# Patient Record
Sex: Female | Born: 1985 | Race: Black or African American | Hispanic: No | Smoking: Current every day smoker
Health system: Southern US, Community
[De-identification: ages and names within clinical notes are randomized; demographics above are authoritative.]

## PROBLEM LIST (undated history)

## (undated) DIAGNOSIS — F1910 Other psychoactive substance abuse, uncomplicated: Secondary | ICD-10-CM

## (undated) DIAGNOSIS — O99320 Drug use complicating pregnancy, unspecified trimester: Secondary | ICD-10-CM

## (undated) HISTORY — PX: ORTHOPEDIC SURGERY: SHX850

---

## 2016-03-24 ENCOUNTER — Emergency Department (HOSPITAL_COMMUNITY): Payer: No Typology Code available for payment source

## 2016-03-24 ENCOUNTER — Inpatient Hospital Stay (HOSPITAL_COMMUNITY): Payer: No Typology Code available for payment source

## 2016-03-24 ENCOUNTER — Encounter (HOSPITAL_COMMUNITY): Payer: Self-pay | Admitting: Emergency Medicine

## 2016-03-24 ENCOUNTER — Inpatient Hospital Stay (HOSPITAL_COMMUNITY)
Admission: EM | Admit: 2016-03-24 | Discharge: 2016-03-30 | DRG: 908 | Disposition: A | Discharge status: Home / Self Care | Payer: No Typology Code available for payment source | Attending: General Surgery | Admitting: General Surgery

## 2016-03-24 ENCOUNTER — Inpatient Hospital Stay (HOSPITAL_COMMUNITY): Payer: No Typology Code available for payment source | Admitting: Anesthesiology

## 2016-03-24 ENCOUNTER — Encounter (HOSPITAL_COMMUNITY): Admission: EM | Disposition: A | Discharge status: Home / Self Care | Payer: Self-pay | Source: Home / Self Care

## 2016-03-24 DIAGNOSIS — Z452 Encounter for adjustment and management of vascular access device: Secondary | ICD-10-CM

## 2016-03-24 DIAGNOSIS — S85152A Other specified injury of anterior tibial artery, left leg, initial encounter: Principal | ICD-10-CM | POA: Diagnosis present

## 2016-03-24 DIAGNOSIS — D62 Acute posthemorrhagic anemia: Secondary | ICD-10-CM | POA: Diagnosis present

## 2016-03-24 DIAGNOSIS — F1490 Cocaine use, unspecified, uncomplicated: Secondary | ICD-10-CM | POA: Diagnosis present

## 2016-03-24 DIAGNOSIS — Y92410 Unspecified street and highway as the place of occurrence of the external cause: Secondary | ICD-10-CM | POA: Diagnosis not present

## 2016-03-24 DIAGNOSIS — S81812A Laceration without foreign body, left lower leg, initial encounter: Secondary | ICD-10-CM | POA: Diagnosis present

## 2016-03-24 DIAGNOSIS — M21372 Foot drop, left foot: Secondary | ICD-10-CM | POA: Diagnosis not present

## 2016-03-24 DIAGNOSIS — I959 Hypotension, unspecified: Secondary | ICD-10-CM | POA: Diagnosis present

## 2016-03-24 DIAGNOSIS — D649 Anemia, unspecified: Secondary | ICD-10-CM

## 2016-03-24 DIAGNOSIS — T79A22A Traumatic compartment syndrome of left lower extremity, initial encounter: Secondary | ICD-10-CM | POA: Diagnosis present

## 2016-03-24 DIAGNOSIS — S85142A Laceration of anterior tibial artery, left leg, initial encounter: Secondary | ICD-10-CM

## 2016-03-24 DIAGNOSIS — R4182 Altered mental status, unspecified: Secondary | ICD-10-CM | POA: Diagnosis present

## 2016-03-24 DIAGNOSIS — F17210 Nicotine dependence, cigarettes, uncomplicated: Secondary | ICD-10-CM | POA: Diagnosis present

## 2016-03-24 DIAGNOSIS — S81819A Laceration without foreign body, unspecified lower leg, initial encounter: Secondary | ICD-10-CM

## 2016-03-24 DIAGNOSIS — R52 Pain, unspecified: Secondary | ICD-10-CM

## 2016-03-24 DIAGNOSIS — M7989 Other specified soft tissue disorders: Secondary | ICD-10-CM

## 2016-03-24 DIAGNOSIS — T148XXA Other injury of unspecified body region, initial encounter: Secondary | ICD-10-CM | POA: Diagnosis present

## 2016-03-24 HISTORY — PX: FASCIOTOMY: SHX132

## 2016-03-24 HISTORY — PX: ARTERY EXPLORATION: SHX5110

## 2016-03-24 LAB — CBC
HCT: 29.2 % — ABNORMAL LOW (ref 36.0–46.0)
HEMATOCRIT: 26.6 % — AB (ref 36.0–46.0)
HEMOGLOBIN: 8.7 g/dL — AB (ref 12.0–15.0)
Hemoglobin: 9.8 g/dL — ABNORMAL LOW (ref 12.0–15.0)
MCH: 32.8 pg (ref 26.0–34.0)
MCH: 29.2 pg (ref 26.0–34.0)
MCHC: 32.7 g/dL (ref 30.0–36.0)
MCHC: 33.6 g/dL (ref 30.0–36.0)
MCV: 100.4 fL — AB (ref 78.0–100.0)
MCV: 86.9 fL (ref 78.0–100.0)
PLATELETS: 76 10*3/uL — AB (ref 150–400)
Platelets: 206 10*3/uL (ref 150–400)
RBC: 2.65 MIL/uL — AB (ref 3.87–5.11)
RBC: 3.36 MIL/uL — ABNORMAL LOW (ref 3.87–5.11)
RDW: 12.5 % (ref 11.5–15.5)
RDW: 15.6 % — ABNORMAL HIGH (ref 11.5–15.5)
WBC: 3.5 10*3/uL — ABNORMAL LOW (ref 4.0–10.5)
WBC: 13.2 10*3/uL — AB (ref 4.0–10.5)

## 2016-03-24 LAB — BASIC METABOLIC PANEL
ANION GAP: 4 — AB (ref 5–15)
BUN: 8 mg/dL (ref 6–20)
CALCIUM: 7.2 mg/dL — AB (ref 8.9–10.3)
CO2: 29 mmol/L (ref 22–32)
Chloride: 107 mmol/L (ref 101–111)
Creatinine, Ser: 1.11 mg/dL — ABNORMAL HIGH (ref 0.44–1.00)
GFR calc Af Amer: >60 mL/min (ref >60)
GLUCOSE: 100 mg/dL — AB (ref 65–99)
Potassium: 3.8 mmol/L (ref 3.5–5.1)
Sodium: 140 mmol/L (ref 135–145)

## 2016-03-24 LAB — PREPARE RBC (CROSSMATCH)

## 2016-03-24 LAB — I-STAT CHEM 8, ED
BUN: 9 mg/dL (ref 6–20)
CALCIUM ION: 1.03 mmol/L — AB (ref 1.15–1.40)
Chloride: 99 mmol/L — ABNORMAL LOW (ref 101–111)
Creatinine, Ser: 1.6 mg/dL — ABNORMAL HIGH (ref 0.44–1.00)
GLUCOSE: 334 mg/dL — AB (ref 65–99)
HCT: 25 % — ABNORMAL LOW (ref 36.0–46.0)
HEMOGLOBIN: 8.5 g/dL — AB (ref 12.0–15.0)
Potassium: 3.3 mmol/L — ABNORMAL LOW (ref 3.5–5.1)
SODIUM: 136 mmol/L (ref 135–145)
TCO2: 16 mmol/L (ref 0–100)

## 2016-03-24 LAB — POCT I-STAT 4, (NA,K, GLUC, HGB,HCT)
Glucose, Bld: 102 mg/dL — ABNORMAL HIGH (ref 65–99)
Glucose, Bld: 105 mg/dL — ABNORMAL HIGH (ref 65–99)
HEMATOCRIT: 25 % — AB (ref 36.0–46.0)
HEMATOCRIT: 25 % — AB (ref 36.0–46.0)
HEMOGLOBIN: 8.5 g/dL — AB (ref 12.0–15.0)
HEMOGLOBIN: 8.5 g/dL — AB (ref 12.0–15.0)
Potassium: 3.6 mmol/L (ref 3.5–5.1)
Potassium: 5.1 mmol/L (ref 3.5–5.1)
Sodium: 141 mmol/L (ref 135–145)
Sodium: 141 mmol/L (ref 135–145)

## 2016-03-24 LAB — COMPREHENSIVE METABOLIC PANEL
ALBUMIN: 2.4 g/dL — AB (ref 3.5–5.0)
ALT: 10 U/L — ABNORMAL LOW (ref 14–54)
ANION GAP: 20 — AB (ref 5–15)
AST: 23 U/L (ref 15–41)
Alkaline Phosphatase: 39 U/L (ref 38–126)
BUN: 10 mg/dL (ref 6–20)
CALCIUM: 8.1 mg/dL — AB (ref 8.9–10.3)
CHLORIDE: 99 mmol/L — AB (ref 101–111)
CO2: 16 mmol/L — AB (ref 22–32)
Creatinine, Ser: 1.78 mg/dL — ABNORMAL HIGH (ref 0.44–1.00)
GFR calc non Af Amer: 16 mL/min — ABNORMAL LOW (ref >60)
GFR, EST AFRICAN AMERICAN: 19 mL/min — AB (ref >60)
Glucose, Bld: 342 mg/dL — ABNORMAL HIGH (ref 65–99)
POTASSIUM: 3.3 mmol/L — AB (ref 3.5–5.1)
SODIUM: 135 mmol/L (ref 135–145)
Total Bilirubin: 0.3 mg/dL (ref 0.3–1.2)
Total Protein: 4.5 g/dL — ABNORMAL LOW (ref 6.5–8.1)

## 2016-03-24 LAB — URINALYSIS, ROUTINE W REFLEX MICROSCOPIC
BILIRUBIN URINE: NEGATIVE
Glucose, UA: NEGATIVE mg/dL
KETONES UR: NEGATIVE mg/dL
Nitrite: NEGATIVE
PH: 7 (ref 5.0–8.0)
Protein, ur: NEGATIVE mg/dL
Specific Gravity, Urine: 1.021 (ref 1.005–1.030)

## 2016-03-24 LAB — PROTIME-INR
INR: 1.31
INR: 1.29
INR: 1.31
Prothrombin Time: 16.4 seconds — ABNORMAL HIGH (ref 11.4–15.2)
Prothrombin Time: 16.2 seconds — ABNORMAL HIGH (ref 11.4–15.2)
Prothrombin Time: 16.4 seconds — ABNORMAL HIGH (ref 11.4–15.2)

## 2016-03-24 LAB — APTT
APTT: 26 s (ref 24–36)
aPTT: 29 seconds (ref 24–36)

## 2016-03-24 LAB — LACTIC ACID, PLASMA: Lactic Acid, Venous: 1.7 mmol/L (ref 0.5–1.9)

## 2016-03-24 LAB — RAPID URINE DRUG SCREEN, HOSP PERFORMED
Amphetamines: NOT DETECTED
BARBITURATES: NOT DETECTED
Benzodiazepines: NOT DETECTED
COCAINE: POSITIVE — AB
Opiates: NOT DETECTED
TETRAHYDROCANNABINOL: NOT DETECTED

## 2016-03-24 LAB — I-STAT CG4 LACTIC ACID, ED: Lactic Acid, Venous: 12.69 mmol/L (ref 0.5–1.9)

## 2016-03-24 LAB — CDS SEROLOGY

## 2016-03-24 LAB — ABO/RH: ABO/RH(D): O POS

## 2016-03-24 LAB — MRSA PCR SCREENING: MRSA by PCR: NEGATIVE

## 2016-03-24 LAB — ETHANOL

## 2016-03-24 LAB — BLOOD PRODUCT ORDER (VERBAL) VERIFICATION

## 2016-03-24 SURGERY — FASCIOTOMY, UPPER EXTREMITY
Anesthesia: General | Site: Leg Lower | Laterality: Left

## 2016-03-24 MED ORDER — ONDANSETRON HCL 4 MG/2ML IJ SOLN: 4.0000 mg | Freq: Four times a day (QID) | INTRAMUSCULAR | Status: DC | PRN

## 2016-03-24 MED ORDER — PHENYLEPHRINE HCL 10 MG/ML IJ SOLN
INTRAMUSCULAR | Status: DC | PRN
  Administered 2016-03-24: 160 ug via INTRAVENOUS
  Administered 2016-03-24: 120 ug via INTRAVENOUS
  Administered 2016-03-24: 80 ug via INTRAVENOUS
  Administered 2016-03-24: 40 ug via INTRAVENOUS
  Administered 2016-03-24: 120 ug via INTRAVENOUS
  Administered 2016-03-24: 80 ug via INTRAVENOUS
  Administered 2016-03-24 (×2): 120 ug via INTRAVENOUS

## 2016-03-24 MED ORDER — TETANUS-DIPHTH-ACELL PERTUSSIS 5-2.5-18.5 LF-MCG/0.5 IM SUSP
INTRAMUSCULAR | Status: AC
  Filled 2016-03-24: qty 0.5

## 2016-03-24 MED ORDER — HYDROMORPHONE HCL 1 MG/ML IJ SOLN
0.5000 mg | INTRAMUSCULAR | Status: DC | PRN
  Administered 2016-03-24 – 2016-03-25 (×4): 1 mg via INTRAVENOUS
  Filled 2016-03-24 (×4): qty 1

## 2016-03-24 MED ORDER — MORPHINE SULFATE (PF) 4 MG/ML IV SOLN
INTRAVENOUS | Status: AC
  Filled 2016-03-24: qty 1

## 2016-03-24 MED ORDER — SUGAMMADEX SODIUM 500 MG/5ML IV SOLN
INTRAVENOUS | Status: DC | PRN
  Administered 2016-03-24: 300 mg via INTRAVENOUS

## 2016-03-24 MED ORDER — MORPHINE SULFATE (PF) 4 MG/ML IV SOLN
4.0000 mg | Freq: Once | INTRAVENOUS | Status: AC
Start: 2016-03-24 — End: 2016-03-24
  Administered 2016-03-24: 4 mg via INTRAVENOUS

## 2016-03-24 MED ORDER — ALBUMIN HUMAN 5 % IV SOLN
INTRAVENOUS | Status: DC | PRN
  Administered 2016-03-24 (×2): via INTRAVENOUS

## 2016-03-24 MED ORDER — ACETAMINOPHEN 325 MG PO TABS
325.0000 mg | ORAL_TABLET | ORAL | Status: DC | PRN
  Administered 2016-03-26 – 2016-03-28 (×6): 650 mg via ORAL
  Filled 2016-03-24 (×6): qty 2

## 2016-03-24 MED ORDER — LACTATED RINGERS IV SOLN
INTRAVENOUS | Status: DC | PRN
  Administered 2016-03-24 (×2): via INTRAVENOUS

## 2016-03-24 MED ORDER — MIDAZOLAM HCL 2 MG/2ML IJ SOLN
INTRAMUSCULAR | Status: AC
  Filled 2016-03-24: qty 2

## 2016-03-24 MED ORDER — OXYCODONE HCL 5 MG/5ML PO SOLN: 5.0000 mg | Freq: Once | ORAL | Status: DC | PRN

## 2016-03-24 MED ORDER — SUCCINYLCHOLINE CHLORIDE 200 MG/10ML IV SOSY
PREFILLED_SYRINGE | INTRAVENOUS | Status: AC
  Filled 2016-03-24: qty 10

## 2016-03-24 MED ORDER — HYDRALAZINE HCL 20 MG/ML IJ SOLN
5.0000 mg | INTRAMUSCULAR | Status: DC | PRN
  Administered 2016-03-27: 5 mg via INTRAVENOUS
  Filled 2016-03-24: qty 1

## 2016-03-24 MED ORDER — GUAIFENESIN-DM 100-10 MG/5ML PO SYRP: 15.0000 mL | ORAL_SOLUTION | ORAL | Status: DC | PRN

## 2016-03-24 MED ORDER — SODIUM CHLORIDE 0.9 % IV SOLN: 10.0000 mL/h | Freq: Once | INTRAVENOUS | Status: DC

## 2016-03-24 MED ORDER — PHENYLEPHRINE HCL 10 MG/ML IJ SOLN
INTRAVENOUS | Status: DC | PRN
  Administered 2016-03-24: 50 ug/min via INTRAVENOUS

## 2016-03-24 MED ORDER — ACETAMINOPHEN 325 MG PO TABS
650.0000 mg | ORAL_TABLET | ORAL | Status: DC | PRN
  Administered 2016-03-24: 650 mg via ORAL
  Filled 2016-03-24: qty 2

## 2016-03-24 MED ORDER — HEMOSTATIC AGENTS (NO CHARGE) OPTIME
TOPICAL | Status: DC | PRN
  Administered 2016-03-24: 1 via TOPICAL

## 2016-03-24 MED ORDER — OXYCODONE HCL 5 MG PO TABS
10.0000 mg | ORAL_TABLET | ORAL | Status: DC | PRN
  Administered 2016-03-24 – 2016-03-26 (×10): 10 mg via ORAL
  Filled 2016-03-24 (×11): qty 2

## 2016-03-24 MED ORDER — LACTATED RINGERS IV SOLN
INTRAVENOUS | Status: DC | PRN
  Administered 2016-03-24: 07:00:00 via INTRAVENOUS

## 2016-03-24 MED ORDER — SODIUM CHLORIDE 0.9 % IV SOLN
INTRAVENOUS | Status: DC | PRN
  Administered 2016-03-24: 1000 mL via INTRAVENOUS

## 2016-03-24 MED ORDER — ONDANSETRON HCL 4 MG/2ML IJ SOLN
4.0000 mg | Freq: Four times a day (QID) | INTRAMUSCULAR | Status: DC | PRN
  Filled 2016-03-24: qty 2

## 2016-03-24 MED ORDER — PANTOPRAZOLE SODIUM 40 MG PO TBEC: 40.0000 mg | DELAYED_RELEASE_TABLET | Freq: Every day | ORAL | Status: DC

## 2016-03-24 MED ORDER — HYDROMORPHONE HCL 2 MG/ML IJ SOLN
0.5000 mg | INTRAMUSCULAR | Status: DC | PRN
  Administered 2016-03-24: 0.5 mg via INTRAVENOUS
  Filled 2016-03-24: qty 1

## 2016-03-24 MED ORDER — LABETALOL HCL 5 MG/ML IV SOLN
10.0000 mg | INTRAVENOUS | Status: DC | PRN
  Filled 2016-03-24: qty 4

## 2016-03-24 MED ORDER — FENTANYL CITRATE (PF) 100 MCG/2ML IJ SOLN
INTRAMUSCULAR | Status: AC
  Filled 2016-03-24: qty 4

## 2016-03-24 MED ORDER — SUCCINYLCHOLINE CHLORIDE 20 MG/ML IJ SOLN
INTRAMUSCULAR | Status: DC | PRN
  Administered 2016-03-24 (×2): 100 mg via INTRAVENOUS

## 2016-03-24 MED ORDER — ONDANSETRON HCL 4 MG/2ML IJ SOLN
INTRAMUSCULAR | Status: AC
  Administered 2016-03-24: 4 mg
  Filled 2016-03-24: qty 2

## 2016-03-24 MED ORDER — METOPROLOL TARTRATE 5 MG/5ML IV SOLN
2.0000 mg | INTRAVENOUS | Status: DC | PRN
Start: 2016-03-24 — End: 2016-03-30

## 2016-03-24 MED ORDER — ONDANSETRON HCL 4 MG PO TABS: 4.0000 mg | ORAL_TABLET | Freq: Four times a day (QID) | ORAL | Status: DC | PRN

## 2016-03-24 MED ORDER — SODIUM CHLORIDE 0.9 % IV SOLN: 10.0000 mL/h | Freq: Once | INTRAVENOUS | Status: AC

## 2016-03-24 MED ORDER — IBUPROFEN 400 MG PO TABS: 600.0000 mg | ORAL_TABLET | Freq: Once | ORAL | Status: DC

## 2016-03-24 MED ORDER — PHENYLEPHRINE 40 MCG/ML (10ML) SYRINGE FOR IV PUSH (FOR BLOOD PRESSURE SUPPORT)
PREFILLED_SYRINGE | INTRAVENOUS | Status: AC
  Filled 2016-03-24: qty 20

## 2016-03-24 MED ORDER — PROPOFOL 10 MG/ML IV BOLUS
INTRAVENOUS | Status: AC
  Filled 2016-03-24: qty 20

## 2016-03-24 MED ORDER — ALUM & MAG HYDROXIDE-SIMETH 200-200-20 MG/5ML PO SUSP: 15.0000 mL | ORAL | Status: DC | PRN

## 2016-03-24 MED ORDER — EPHEDRINE SULFATE 50 MG/ML IJ SOLN
INTRAMUSCULAR | Status: DC | PRN
  Administered 2016-03-24: 10 mg via INTRAVENOUS

## 2016-03-24 MED ORDER — ACETAMINOPHEN 650 MG RE SUPP: 325.0000 mg | RECTAL | Status: DC | PRN

## 2016-03-24 MED ORDER — ROCURONIUM BROMIDE 100 MG/10ML IV SOLN
INTRAVENOUS | Status: DC | PRN
  Administered 2016-03-24: 50 mg via INTRAVENOUS

## 2016-03-24 MED ORDER — PROPOFOL 10 MG/ML IV BOLUS
INTRAVENOUS | Status: DC | PRN
  Administered 2016-03-24: 120 mg via INTRAVENOUS
  Administered 2016-03-24: 200 mg via INTRAVENOUS

## 2016-03-24 MED ORDER — FENTANYL CITRATE (PF) 100 MCG/2ML IJ SOLN
50.0000 ug | Freq: Once | INTRAMUSCULAR | Status: AC
  Administered 2016-03-24: 50 ug via INTRAVENOUS

## 2016-03-24 MED ORDER — DEXTROSE 5 % IV SOLN
1.5000 g | Freq: Two times a day (BID) | INTRAVENOUS | Status: AC
  Administered 2016-03-24 – 2016-03-25 (×2): 1.5 g via INTRAVENOUS
  Filled 2016-03-24 (×3): qty 1.5

## 2016-03-24 MED ORDER — FENTANYL CITRATE (PF) 100 MCG/2ML IJ SOLN
INTRAMUSCULAR | Status: DC | PRN
  Administered 2016-03-24: 25 ug via INTRAVENOUS

## 2016-03-24 MED ORDER — CEFAZOLIN SODIUM 1 G IJ SOLR
INTRAMUSCULAR | Status: DC | PRN
  Administered 2016-03-24: 1 g via INTRAMUSCULAR

## 2016-03-24 MED ORDER — PHENOL 1.4 % MT LIQD
1.0000 | OROMUCOSAL | Status: DC | PRN
Start: 2016-03-24 — End: 2016-03-30

## 2016-03-24 MED ORDER — FENTANYL CITRATE (PF) 100 MCG/2ML IJ SOLN
INTRAMUSCULAR | Status: AC
  Administered 2016-03-24: 03:00:00
  Filled 2016-03-24: qty 2

## 2016-03-24 MED ORDER — CHLORHEXIDINE GLUCONATE CLOTH 2 % EX PADS: 6.0000 | MEDICATED_PAD | Freq: Every day | CUTANEOUS | Status: DC

## 2016-03-24 MED ORDER — LIDOCAINE HCL (CARDIAC) 20 MG/ML IV SOLN
INTRAVENOUS | Status: DC | PRN
  Administered 2016-03-24: 60 mg via INTRAVENOUS

## 2016-03-24 MED ORDER — ONDANSETRON HCL 4 MG/2ML IJ SOLN
INTRAMUSCULAR | Status: AC
  Filled 2016-03-24: qty 2

## 2016-03-24 MED ORDER — ONDANSETRON HCL 4 MG/2ML IJ SOLN
4.0000 mg | Freq: Four times a day (QID) | INTRAMUSCULAR | Status: AC | PRN
  Administered 2016-03-24: 4 mg via INTRAVENOUS

## 2016-03-24 MED ORDER — 0.9 % SODIUM CHLORIDE (POUR BTL) OPTIME
TOPICAL | Status: DC | PRN
  Administered 2016-03-24: 1000 mL

## 2016-03-24 MED ORDER — OXYCODONE HCL 5 MG PO TABS: 5.0000 mg | ORAL_TABLET | Freq: Once | ORAL | Status: DC | PRN

## 2016-03-24 MED ORDER — SODIUM CHLORIDE 0.9 % IV SOLN
INTRAVENOUS | Status: DC
  Administered 2016-03-24: 21:00:00 via INTRAVENOUS
  Administered 2016-03-24: 100 mL/h via INTRAVENOUS
  Administered 2016-03-24 – 2016-03-28 (×3): via INTRAVENOUS

## 2016-03-24 MED ORDER — HYDROMORPHONE HCL 1 MG/ML IJ SOLN: 0.2500 mg | INTRAMUSCULAR | Status: DC | PRN

## 2016-03-24 MED ORDER — LIDOCAINE 5 % EX PTCH: 1.0000 | MEDICATED_PATCH | Freq: Once | CUTANEOUS | Status: DC

## 2016-03-24 MED ORDER — IOPAMIDOL (ISOVUE-300) INJECTION 61%
INTRAVENOUS | Status: AC
  Filled 2016-03-24: qty 100

## 2016-03-24 MED ORDER — PANTOPRAZOLE SODIUM 40 MG PO TBEC
40.0000 mg | DELAYED_RELEASE_TABLET | Freq: Every day | ORAL | Status: DC
  Administered 2016-03-25: 40 mg via ORAL
  Filled 2016-03-24: qty 1

## 2016-03-24 MED ORDER — TETANUS-DIPHTH-ACELL PERTUSSIS 5-2.5-18.5 LF-MCG/0.5 IM SUSP
0.5000 mL | Freq: Once | INTRAMUSCULAR | Status: AC
  Administered 2016-03-24: 0.5 mL via INTRAMUSCULAR

## 2016-03-24 MED ORDER — SODIUM CHLORIDE 0.9 % IV BOLUS (SEPSIS)
1000.0000 mL | Freq: Once | INTRAVENOUS | Status: AC
Start: 2016-03-24 — End: 2016-03-24
  Administered 2016-03-24: 1000 mL via INTRAVENOUS

## 2016-03-24 MED ORDER — FENTANYL CITRATE (PF) 100 MCG/2ML IJ SOLN
INTRAMUSCULAR | Status: DC | PRN
  Administered 2016-03-24: 50 ug via INTRAVENOUS

## 2016-03-24 MED ORDER — LIDOCAINE 2% (20 MG/ML) 5 ML SYRINGE
INTRAMUSCULAR | Status: AC
  Filled 2016-03-24: qty 5

## 2016-03-24 MED ORDER — DOCUSATE SODIUM 100 MG PO CAPS
100.0000 mg | ORAL_CAPSULE | Freq: Every day | ORAL | Status: DC
  Administered 2016-03-25 – 2016-03-30 (×6): 100 mg via ORAL
  Filled 2016-03-24 (×6): qty 1

## 2016-03-24 MED ORDER — PANTOPRAZOLE SODIUM 40 MG IV SOLR
40.0000 mg | Freq: Every day | INTRAVENOUS | Status: DC
  Administered 2016-03-24: 40 mg via INTRAVENOUS
  Filled 2016-03-24: qty 40

## 2016-03-24 MED ORDER — IOPAMIDOL (ISOVUE-370) INJECTION 76%
INTRAVENOUS | Status: AC
  Administered 2016-03-24: 100 mL via INTRAVENOUS
  Filled 2016-03-24: qty 100

## 2016-03-24 SURGICAL SUPPLY — 46 items
CANISTER SUCT 3000ML PPV (MISCELLANEOUS) ×2 IMPLANT
CANISTER WOUND CARE 500ML ATS (WOUND CARE) ×4 IMPLANT
CLIP TI MEDIUM 24 (CLIP) ×4 IMPLANT
CLIP TI MEDIUM 6 (CLIP) ×2 IMPLANT
CONNECTOR Y ATS VAC SYSTEM (MISCELLANEOUS) ×2 IMPLANT
COUNTER NEEDLE 20 DBL MAG RED (NEEDLE) ×2 IMPLANT
COVER SURGICAL LIGHT HANDLE (MISCELLANEOUS) ×2 IMPLANT
DRAPE ORTHO SPLIT 87X125 STRL (DRAPES) ×4 IMPLANT
DRSG TEGADERM 4X4.75 (GAUZE/BANDAGES/DRESSINGS) ×6 IMPLANT
DRSG VAC ATS MED SENSATRAC (GAUZE/BANDAGES/DRESSINGS) ×4 IMPLANT
ELECT REM PT RETURN 9FT ADLT (ELECTROSURGICAL) ×2
ELECTRODE REM PT RTRN 9FT ADLT (ELECTROSURGICAL) ×1 IMPLANT
GAUZE SPONGE 2X2 8PLY STRL LF (GAUZE/BANDAGES/DRESSINGS) ×2 IMPLANT
GAUZE SPONGE 4X4 12PLY STRL (GAUZE/BANDAGES/DRESSINGS) ×2 IMPLANT
GLOVE BIO SURGEON STRL SZ7.5 (GLOVE) ×2 IMPLANT
GLOVE BIOGEL PI IND STRL 6.5 (GLOVE) ×1 IMPLANT
GLOVE BIOGEL PI IND STRL 7.5 (GLOVE) ×2 IMPLANT
GLOVE BIOGEL PI INDICATOR 6.5 (GLOVE) ×1
GLOVE BIOGEL PI INDICATOR 7.5 (GLOVE) ×2
GLOVE SS BIOGEL STRL SZ 7.5 (GLOVE) ×1 IMPLANT
GLOVE SUPERSENSE BIOGEL SZ 7.5 (GLOVE) ×1
GLOVE SURG SS PI 7.0 STRL IVOR (GLOVE) ×2 IMPLANT
GOWN STRL REUS W/ TWL LRG LVL3 (GOWN DISPOSABLE) ×2 IMPLANT
GOWN STRL REUS W/TWL LRG LVL3 (GOWN DISPOSABLE) ×2
HEMOSTAT SPONGE AVITENE ULTRA (HEMOSTASIS) ×2 IMPLANT
KIT BASIN OR (CUSTOM PROCEDURE TRAY) ×2 IMPLANT
KIT ROOM TURNOVER OR (KITS) ×2 IMPLANT
NS IRRIG 1000ML POUR BTL (IV SOLUTION) ×2 IMPLANT
PACK GENERAL/GYN (CUSTOM PROCEDURE TRAY) ×2 IMPLANT
PAD ARMBOARD 7.5X6 YLW CONV (MISCELLANEOUS) ×4 IMPLANT
PAD NEG PRESSURE SENSATRAC (MISCELLANEOUS) ×2 IMPLANT
PAD SHARPS MAGNETIC DISPOSAL (MISCELLANEOUS) ×2 IMPLANT
SPONGE GAUZE 2X2 STER 10/PKG (GAUZE/BANDAGES/DRESSINGS) ×2
STAPLER VISISTAT 35W (STAPLE) IMPLANT
SUT ETHILON 3 0 PS 1 (SUTURE) IMPLANT
SUT PROLENE 5 0 C 1 24 (SUTURE) ×12 IMPLANT
SUT SILK 2 0 SH (SUTURE) ×2 IMPLANT
SUT SILK 3 0 (SUTURE) ×1
SUT SILK 3-0 18XBRD TIE 12 (SUTURE) ×1 IMPLANT
SUT VIC AB 2-0 CT1 27 (SUTURE) ×2
SUT VIC AB 2-0 CT1 TAPERPNT 27 (SUTURE) ×2 IMPLANT
SUT VIC AB 2-0 CTX 36 (SUTURE) IMPLANT
SUT VIC AB 3-0 SH 27 (SUTURE)
SUT VIC AB 3-0 SH 27X BRD (SUTURE) IMPLANT
SUT VICRYL 4-0 PS2 18IN ABS (SUTURE) IMPLANT
WATER STERILE IRR 1000ML POUR (IV SOLUTION) ×2 IMPLANT

## 2016-03-24 NOTE — Progress Notes (Signed)
Patient's belongings picked up from security and given back to patient. It was a cell phone and 45 cents. Ashlen Kiger, [place] ScrapeSarah E, RN

## 2016-03-24 NOTE — ED Notes (Signed)
First unit of FFP [id number] started.

## 2016-03-24 NOTE — Anesthesia Preprocedure Evaluation (Addendum)
Anesthesia Evaluation  Patient identified by MRN, date of birth, ID band Patient awake  General Assessment Comment:Lethargic but able to answer questions  Reviewed: Allergy & Precautions, NPO status , Patient's Chart, lab work & pertinent test results  Airway Mallampati: I  TM Distance: >3 FB Neck ROM: Full    Dental  (+) Poor Dentition,    Pulmonary Current Smoker,    Pulmonary exam normal breath sounds clear to auscultation       Cardiovascular negative cardio ROS Normal cardiovascular exam Rhythm:regular Rate:Normal     Neuro/Psych    GI/Hepatic   Endo/Other    Renal/GU Renal InsufficiencyRenal disease     Musculoskeletal   Abdominal   Peds  Hematology   Anesthesia Other Findings   Reproductive/Obstetrics                            Anesthesia Physical Anesthesia Plan  ASA: II and emergent  Anesthesia Plan: General   Post-op Pain Management:    Induction: Intravenous, Cricoid pressure planned and Rapid sequence  Airway Management Planned: Oral ETT  Additional Equipment:   Intra-op Plan:   Post-operative Plan: Extubation in OR  Informed Consent:   Plan Discussed with: CRNA, Anesthesiologist and Surgeon  Anesthesia Plan Comments:         Anesthesia Quick Evaluation

## 2016-03-24 NOTE — ED Notes (Signed)
PRBC num: [id number] started on rapid infusion.

## 2016-03-24 NOTE — Progress Notes (Signed)
1 FFP completed 1U RBC completed 1 Plasma completed  in PACU per MD
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
## 2016-03-24 NOTE — Progress Notes (Signed)
Patient ID: Jamie Kirk, female   DOB: 1985/10/27, 31 y.o.   MRN: [id number] Back from OR VAC L calf, palp DP VSS Clear liquids F/U CBC.  Jamie GelinasBurke Amelya Mabry, MD, MPH, FACS Trauma: [phone] General Surgery: [phone]

## 2016-03-24 NOTE — Anesthesia Procedure Notes (Signed)
Central Venous Catheter Insertion Performed by: Chaney MallingHODIERNE, Ambrielle Kington, anesthesiologist Start/End3/08/2016 7:11 AM, 03/24/2016 7:20 AM Patient location: OR. Preanesthetic checklist: patient identified, IV checked, site marked, risks and benefits discussed, surgical consent, monitors and equipment checked, pre-op evaluation, timeout performed and anesthesia consent Position: Trendelenburg Lidocaine 1% used for infiltration and patient sedated Hand hygiene performed , maximum sterile barriers used  and Seldinger technique used Catheter size: 7 Fr Central line was placed.Triple lumen Procedure performed using ultrasound guided technique. Ultrasound Notes:anatomy identified, needle tip was noted to be adjacent to the nerve/plexus identified and no ultrasound evidence of intravascular and/or intraneural injection Attempts: 1 Following insertion, line sutured, dressing applied and Biopatch. Post procedure assessment: blood return through all ports, free fluid flow and no air  Patient tolerated the procedure well with no immediate complications.

## 2016-03-24 NOTE — ED Notes (Signed)
First unit of FFP  Completed.

## 2016-03-24 NOTE — ED Notes (Signed)
Zofran 4mg  IV given with Fentanyl 50 mcg.
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
## 2016-03-24 NOTE — Anesthesia Postprocedure Evaluation (Signed)
Anesthesia Post Note  Patient: Jamie Kirk  Procedure(s) Performed: Procedure(s) (LRB): Left Leg Four Compartment Fasciotomy; Ligation of Anterior Tibial Artery; Application of Wound Vac (Left) ARTERY EXPLORATION (Left)  Patient location during evaluation: PACU Anesthesia Type: General Level of consciousness: awake and alert Pain management: pain level controlled Vital Signs Assessment: post-procedure vital signs reviewed and stable Respiratory status: spontaneous breathing, nonlabored ventilation and respiratory function stable Cardiovascular status: blood pressure returned to baseline and stable Postop Assessment: no signs of nausea or vomiting Anesthetic complications: no       Last Vitals:  Vitals:   03/24/16 1100 03/24/16 1200  BP: (!) 148/101 (!) 147/95  Pulse:    Resp: 10 14  Temp:      Last Pain:  Vitals:   03/24/16 1107  TempSrc:   PainSc: Asleep                 Johnel Yielding,W. EDMOND

## 2016-03-24 NOTE — ED Notes (Signed)
FFP completed.
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
## 2016-03-24 NOTE — ED Notes (Signed)
PRBC number: [id number] started.

## 2016-03-24 NOTE — ED Notes (Signed)
Second unit of FFP started unit num: [id number]

## 2016-03-24 NOTE — ED Notes (Signed)
PER DR. Lindie SpruceWYATT:  Pulses palpated and dopplered in RIGHT FOOT included: dorsalis pedis and posterior tibial   Pulses dopplered in LEFT FOOT included: Posterior tibial  Pulses were not palpable in LEFT FOOT, only able to obtain a dopplered posterior tibial.
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
## 2016-03-24 NOTE — Transfer of Care (Signed)
Immediate Anesthesia Transfer of Care Note  Patient: Jamie Kirk  Procedure(s) Performed: Procedure(s): Left Leg Four Compartment Fasciotomy; Ligation of Anterior Tibial Artery; Application of Wound Vac (Left) ARTERY EXPLORATION (Left)  Patient Location: PACU  Anesthesia Type:General  Level of Consciousness: patient cooperative and responds to stimulation  Airway & Oxygen Therapy: Patient Spontanous Breathing and Patient connected to nasal cannula oxygen  Post-op Assessment: Report given to RN, Post -op Vital signs reviewed and stable and Patient moving all extremities X 4  Post vital signs: Reviewed and stable  Last Vitals:  Vitals:   03/24/16 0435 03/24/16 0800  BP: 135/92 99/85  Pulse: 88   Resp: 16   Temp:  36.6 C    Last Pain:  Vitals:   03/24/16 0800  TempSrc: Temporal  PainSc: (P) Asleep         Complications: No apparent anesthesia complications

## 2016-03-24 NOTE — Anesthesia Procedure Notes (Signed)
Procedure Name: Intubation Date/Time: 03/24/2016 5:15 AM Performed by: Molli HazardGORDON, Jamie Kirk Pre-anesthesia Checklist: Patient identified, Emergency Drugs available, Suction available, Patient being monitored and Timeout performed Patient Re-evaluated:Patient Re-evaluated prior to inductionOxygen Delivery Method: Circle system utilized Preoxygenation: Pre-oxygenation with 100% oxygen Intubation Type: IV induction, Rapid sequence and Cricoid Pressure applied Laryngoscope Size: Miller and 2 Grade View: Grade I Tube type: Oral Tube size: 7.5 mm Number of attempts: 1 Airway Equipment and Method: Stylet Placement Confirmation: ETT inserted through vocal cords under direct vision,  positive ETCO2 and breath sounds checked- equal and bilateral Secured at: 21 cm Tube secured with: Tape Dental Injury: Teeth and Oropharynx as per pre-operative assessment

## 2016-03-24 NOTE — Progress Notes (Signed)
     Patient resting in PACU Doppler biphasic left PT Wound vac working well S/P Left leg 4 compartment fasciotomy with ligation of anterior tibial artery  Disposition stable left LE arterial flow  Shaelynn Dragos MAUREEN PA-C
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
## 2016-03-25 ENCOUNTER — Encounter (HOSPITAL_COMMUNITY): Payer: Self-pay

## 2016-03-25 ENCOUNTER — Encounter (HOSPITAL_COMMUNITY): Payer: Self-pay | Admitting: Vascular Surgery

## 2016-03-25 LAB — BPAM PLATELET PHERESIS
Blood Product Expiration Date: 201803082359
ISSUE DATE / TIME: 201803080733
Unit Type and Rh: 7300

## 2016-03-25 LAB — BPAM FFP
BLOOD PRODUCT EXPIRATION DATE: 201803122359
BLOOD PRODUCT EXPIRATION DATE: 201803122359
BLOOD PRODUCT EXPIRATION DATE: 201803112359
Blood Product Expiration Date: 201803112359
Blood Product Expiration Date: 201803112359
Blood Product Expiration Date: 201803112359
ISSUE DATE / TIME: 201803080229
ISSUE DATE / TIME: 201803080229
ISSUE DATE / TIME: 201803080733
ISSUE DATE / TIME: 201803080733
ISSUE DATE / TIME: 201803080733
ISSUE DATE / TIME: 201803080733
UNIT TYPE AND RH: 6200
UNIT TYPE AND RH: 5100
UNIT TYPE AND RH: 5100
Unit Type and Rh: 6200
Unit Type and Rh: 5100
Unit Type and Rh: 5100

## 2016-03-25 LAB — PREPARE FRESH FROZEN PLASMA
UNIT DIVISION: 0
UNIT DIVISION: 0
UNIT DIVISION: 0
Unit division: 0
Unit division: 0
Unit division: 0

## 2016-03-25 LAB — PREPARE PLATELET PHERESIS: Unit division: 0

## 2016-03-25 LAB — HIV ANTIBODY (ROUTINE TESTING W REFLEX): HIV SCREEN 4TH GENERATION: NONREACTIVE

## 2016-03-25 MED ORDER — HYDROMORPHONE HCL 2 MG/ML IJ SOLN
0.5000 mg | INTRAMUSCULAR | Status: DC | PRN
  Administered 2016-03-25 – 2016-03-26 (×12): 1 mg via INTRAVENOUS
  Filled 2016-03-25 (×13): qty 1
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
## 2016-03-25 NOTE — Progress Notes (Addendum)
  [facility] Progress Note  Subjective  - POD #1  Sleeping. Barely wakes up to answer questions. Nods when asked if her left foot numbness and pain is better.   Objective Vitals:   03/25/16 0300 03/25/16 0500  BP: 124/87 (!) 137/93  Pulse: 81 91  Resp: 10 (!) 22  Temp:  99.6 F (37.6 C)    Intake/Output Summary (Last 24 hours) at 03/25/16 0733 Last data filed at 03/25/16 0600  Gross per 24 hour  Intake           4650.5 ml  Output             3670 ml  Net            980.5 ml   Left lower extremity with VAC dressings Biphasic left DP, peroneal and PT signals. Left foot warm. Moving toes. Sensation intact.   Assessment/Planning: 31 y.o. female is s/p: left leg 4 compartment fasciotomy with ligation of anterior tibial artery 1 Day Post-Op   Motor and sensory function intact left foot. Will keep VAC on today. Will probably close fasciotomies next week, but will discuss with Dr. Darrick PennaFields.  Raymond GurneyKimberly A Trinh 03/25/2016 7:33 AM -- Agree with above.  Ok to ambulate.  VAC change Monday.  Possibly close fasciotomy Monday  Fabienne Brunsharles Fields, MD [facility] [place] Office: [phone] Pager: [phone]  Laboratory CBC    Component Value Date/Time   WBC 13.2 (H) 03/24/2016 1153   HGB 9.8 (L) 03/24/2016 1153   HCT 29.2 (L) 03/24/2016 1153   PLT 76 (L) 03/24/2016 1153    BMET    Component Value Date/Time   NA 140 03/24/2016 1153   K 3.8 03/24/2016 1153   CL 107 03/24/2016 1153   CO2 29 03/24/2016 1153   GLUCOSE 100 (H) 03/24/2016 1153   BUN 8 03/24/2016 1153   CREATININE 1.11 (H) 03/24/2016 1153   CALCIUM 7.2 (L) 03/24/2016 1153   GFRNONAA >60 03/24/2016 1153   GFRAA >60 03/24/2016 1153    COAG Lab Results  Component Value Date   INR 1.31 03/24/2016   INR 1.29 03/24/2016   INR 1.31 03/24/2016   No results found for: PTT  Antibiotics Anti-infectives    Start     Dose/Rate Route Frequency Ordered Stop   03/24/16 1200   cefUROXime (ZINACEF) 1.5 g in dextrose 5 % 50 mL IVPB     1.5 g 100 mL/hr over 30 Minutes Intravenous Every 12 hours 03/24/16 1034 03/25/16 0044       Maris BergerKimberly Trinh, PA-C [facility] Office: [phone] Pager: [phone] 03/25/2016 7:33 AM

## 2016-03-26 MED ORDER — OXYCODONE HCL 5 MG PO TABS
15.0000 mg | ORAL_TABLET | ORAL | Status: DC | PRN
  Administered 2016-03-26 – 2016-03-30 (×15): 15 mg via ORAL
  Filled 2016-03-26 (×15): qty 3

## 2016-03-26 MED ORDER — SODIUM CHLORIDE 0.9% FLUSH
10.0000 mL | INTRAVENOUS | Status: DC | PRN
  Administered 2016-03-26 (×2): 10 mL
  Filled 2016-03-26 (×2): qty 40

## 2016-03-26 MED ORDER — SODIUM CHLORIDE 0.9% FLUSH
10.0000 mL | Freq: Two times a day (BID) | INTRAVENOUS | Status: DC
  Administered 2016-03-27: 20 mL
  Administered 2016-03-28 – 2016-03-30 (×3): 10 mL

## 2016-03-26 MED ORDER — CEFAZOLIN IN D5W 1 GM/50ML IV SOLN
1.0000 g | INTRAVENOUS | Status: AC
  Filled 2016-03-26: qty 50

## 2016-03-26 MED ORDER — HYDROMORPHONE HCL 2 MG/ML IJ SOLN
2.0000 mg | INTRAMUSCULAR | Status: DC | PRN
Start: 2016-03-26 — End: 2016-03-26

## 2016-03-26 MED ORDER — HYDROMORPHONE HCL 2 MG/ML IJ SOLN
2.0000 mg | INTRAMUSCULAR | Status: DC | PRN
  Administered 2016-03-26 – 2016-03-30 (×28): 2 mg via INTRAVENOUS
  Filled 2016-03-26 (×28): qty 1
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
## 2016-03-26 NOTE — Progress Notes (Signed)
Pt  does not feel her pain control is adequate and wants both the oxycodone and dilaudid at the same time.  I explained this is not in her best interest, Change dilaudid to 2 mg IV q 2 and increase her oxyIR to 15 mg po q 4 prn  Leg appears viable and she does not appear to be in any distress   Good perfusion to both lower extremities at this point  May get fasciotomies closed on Monday

## 2016-03-26 NOTE — Progress Notes (Signed)
Lt IJ removed per order. Pressure dressing applied. Pt instructed to remain flat for 30 minutes after removal. Also instructed to leave dressing in place for 24 hours.

## 2016-03-26 NOTE — Progress Notes (Signed)
   VASCULAR SURGERY ASSESSMENT & PLAN:  2 Days Post-Op s/p: 4 Compartment fasciotomy and ligation of left ATA  Possibly for closure of fasciotomy sites Monday.  SUBJECTIVE: Wants Pain medicine.  PHYSICAL EXAM: Vitals:   03/25/16 1322 03/25/16 1700 03/25/16 2010 03/26/16 0408  BP: (!) 149/108 (!) 154/103 (!) 152/90 (!) 143/88  Pulse: 78 79 88 99  Resp: 17 17 19 19   Temp: 99.3 F (37.4 C) 99.3 F (37.4 C) 98.3 F (36.8 C) 98.2 F (36.8 C)  TempSrc: Oral Oral Oral Oral  SpO2: 100% 100% 100% 100%  Weight:      Height:       Left foot warm and well perfused. VAC with good seal  LABS: Lab Results  Component Value Date   WBC 13.2 (H) 03/24/2016   HGB 9.8 (L) 03/24/2016   HCT 29.2 (L) 03/24/2016   MCV 86.9 03/24/2016   PLT 76 (L) 03/24/2016   Lab Results  Component Value Date   CREATININE 1.11 (H) 03/24/2016   Lab Results  Component Value Date   INR 1.31 03/24/2016   CBG (last 3)  No results for input(s): GLUCAP in the last 72 hours.  Active Problems:   Vascular injury    Cari CarawayChris Danyela Posas Beeper: [phone] 03/26/2016

## 2016-03-26 NOTE — Progress Notes (Signed)
Pt was agitated and irritable with multiple threats to leave.  She continued to remove wound vac dressing in small pieces and turning off machine. had to be educated on risks of infection and slower healing times.  Continued to lift bandage on central line and touch and pull at iv lines. Educated on risks of contained behavior.  Pain management difficult with all therapies including medication, behavior and emotional support.
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
## 2016-03-27 LAB — RAPID URINE DRUG SCREEN, HOSP PERFORMED
AMPHETAMINES: NOT DETECTED
BARBITURATES: NOT DETECTED
BENZODIAZEPINES: NOT DETECTED
Cocaine: POSITIVE — AB
OPIATES: POSITIVE — AB
Tetrahydrocannabinol: NOT DETECTED
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
## 2016-03-27 NOTE — Progress Notes (Signed)
   VASCULAR SURGERY ASSESSMENT & PLAN:  3 Days Post-Op s/p: 4 compartment fasciotomy and ligation of left anterior tibial artery.  We'll make him nothing by mouth tonight in anticipation of closure of fasciotomy sites tomorrow.  I discussed this with the patient.  SUBJECTIVE: No complaints this morning.  PHYSICAL EXAM: Vitals:   03/26/16 0408 03/26/16 1337 03/26/16 2100 03/27/16 0620  BP: (!) 143/88 (!) 142/83 99/84 (!) 142/98  Pulse: 99 74 78 63  Resp: 19 19 18 18   Temp: 98.2 F (36.8 C) 98.3 F (36.8 C) 98.4 F (36.9 C) 98.4 F (36.9 C)  TempSrc: Oral Oral Oral Oral  SpO2: 100% 99% 100% 100%  Weight:      Height:       VAC with good seal.  Active Problems:   Vascular injury  Cari CarawayChris Dickson Beeper: [phone] 03/27/2016

## 2016-03-27 NOTE — Progress Notes (Signed)
Urine drug screen positive for cocaine. Dr. Donell BeersByerly notified. Awaiting on return call

## 2016-03-27 NOTE — Progress Notes (Signed)
   03/27/16 1334  Vitals  Temp 98.4 F (36.9 C)  Temp Source Oral  BP (!) 143/112  BP Location Left Arm  BP Method Automatic  Patient Position (if appropriate) Sitting  Pulse Rate 96  Pulse Rate Source Dinamap  Resp 18  Oxygen Therapy  SpO2 95 %  O2 Device Room Air   Dr. Donell BeersByerly notified of BP. Medication given and BP is now 150/97. Urine drug screen also ordered.

## 2016-03-27 NOTE — Progress Notes (Signed)
Patient request dosing PRN dilaudid and oxyIR together. [place], CN and Dr Luisa Hartornett notified. Medication safety discussed with patient and PRN dilaudid given.

## 2016-03-27 NOTE — Progress Notes (Signed)
Patient request MD consult for pain management. Dr Luisa Hartornett notified of patient request.

## 2016-03-28 ENCOUNTER — Encounter (HOSPITAL_COMMUNITY): Admission: EM | Disposition: A | Discharge status: Home / Self Care | Payer: Self-pay | Source: Home / Self Care

## 2016-03-28 LAB — TYPE AND SCREEN
ABO/RH(D): O POS
ANTIBODY SCREEN: NEGATIVE
UNIT DIVISION: 0
UNIT DIVISION: 0
UNIT DIVISION: 0
Unit division: 0
Unit division: 0
Unit division: 0
Unit division: 0
Unit division: 0

## 2016-03-28 LAB — PROTIME-INR
INR: 0.95
PROTHROMBIN TIME: 12.7 s (ref 11.4–15.2)

## 2016-03-28 LAB — CBC
HCT: 30.9 % — ABNORMAL LOW (ref 36.0–46.0)
HEMOGLOBIN: 10.1 g/dL — AB (ref 12.0–15.0)
MCH: 29.3 pg (ref 26.0–34.0)
MCHC: 32.7 g/dL (ref 30.0–36.0)
MCV: 89.6 fL (ref 78.0–100.0)
PLATELETS: 151 10*3/uL (ref 150–400)
RBC: 3.45 MIL/uL — AB (ref 3.87–5.11)
RDW: 14.7 % (ref 11.5–15.5)
WBC: 9.2 10*3/uL (ref 4.0–10.5)

## 2016-03-28 LAB — BPAM RBC
BLOOD PRODUCT EXPIRATION DATE: 201804092359
Blood Product Expiration Date: 201804072359
Blood Product Expiration Date: 201804072359
Blood Product Expiration Date: 201804092359
Blood Product Expiration Date: 201804092359
Blood Product Expiration Date: 201804092359
Blood Product Expiration Date: 201804092359
Blood Product Expiration Date: 201804092359
ISSUE DATE / TIME: 201803080228
ISSUE DATE / TIME: 201803080228
ISSUE DATE / TIME: 201803080532
ISSUE DATE / TIME: 201803080532
ISSUE DATE / TIME: 201803080620
ISSUE DATE / TIME: 201803080620
ISSUE DATE / TIME: 201803080722
ISSUE DATE / TIME: 201803080722
UNIT TYPE AND RH: 5100
UNIT TYPE AND RH: 5100
UNIT TYPE AND RH: 5100
UNIT TYPE AND RH: 5100
Unit Type and Rh: 5100
Unit Type and Rh: 5100
Unit Type and Rh: 9500
Unit Type and Rh: 9500

## 2016-03-28 LAB — BASIC METABOLIC PANEL
ANION GAP: 8 (ref 5–15)
BUN: 6 mg/dL (ref 6–20)
CALCIUM: 9.4 mg/dL (ref 8.9–10.3)
CO2: 35 mmol/L — ABNORMAL HIGH (ref 22–32)
Chloride: 94 mmol/L — ABNORMAL LOW (ref 101–111)
Creatinine, Ser: 0.89 mg/dL (ref 0.44–1.00)
GFR calc Af Amer: >60 mL/min (ref >60)
Glucose, Bld: 118 mg/dL — ABNORMAL HIGH (ref 65–99)
Potassium: 3.9 mmol/L (ref 3.5–5.1)
SODIUM: 137 mmol/L (ref 135–145)

## 2016-03-28 SURGERY — FASCIOTOMY CLOSURE
Anesthesia: General | Laterality: Left

## 2016-03-28 NOTE — Progress Notes (Signed)
Pt fasciotomy rescheduled for tomorrow Tuesday 3/13  NPO p midnight  Jamie Kirk

## 2016-03-28 NOTE — Progress Notes (Signed)
OT Cancellation    03/28/16 1500  OT Visit Information  Last OT Received On 03/28/16  Reason Eval/Treat Not Completed Patient declined, no reason specified. Pt expecting to have surgery tomorrow 3/13. Will reassess function after surgery.   Ameren CorporationCharis Aune Kirk, OTR/L [phone]
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
## 2016-03-28 NOTE — Progress Notes (Signed)
Patient disconnecting IV and VAC to walk around department. Patient educated on the risks of infection. Encouraged to notify nursing staff and not disconnect herself. Patient's responds "I know how to do this". Will continue to monitor.

## 2016-03-28 NOTE — Progress Notes (Addendum)
I was alerted to room 6N18 by a loud rumble, and the patient cursing. I notified Harriett SineNancy, CN and Stryker Corporationenlisted Audrey, [place] for security notification. Accompanied by additional staff (Jeral Pinchess, RN and HoovenMara, [place]), I entered the patient's room. Upon entry to room,  a box cutter was noted on the room floor; the patient was sitting on the bed and her guest was gathering belongings from the floor. I removed the box cutter from the room floor, the patient admitted ownership. Both patient and guest denied any negative occurrence. Patient's significant other exited the unit after retrieving belongings.The box cutter was ceased by security upon their arrival. Patient continued to deny negative occurrence during security interrogation. Patient was briefly tearful while Mara (NT), Viviann SpareSteven (NT) and I  assisted to trouble shoot and reconnect NPWT device. Patient exhibited no immediate s/s of trauma, but requested an ice pack for right eye (obtained/delivered by Viviann SpareSteven, NT).  .Marland Kitchen

## 2016-03-28 NOTE — Progress Notes (Signed)
Pt. Walking hallways cursing at myself and other staff. Told pt. She could not leave the floor, as she wanted to leave and go smoke/go to the cafeteria. Also alerted pt. That she could not eat/drink. Pt. Did not like that. She continued cursing. PA arrived to floor and talked to pt. Pt. Still yelling/cursing at this point. Security called and pt. Threatened to leave.

## 2016-03-28 NOTE — Progress Notes (Signed)
Patient extremely agitated in the hallway. Yelling at nursing staff. Disconnected from IV and VAC suction. C/O staff being disrespectful and she wants to eat.Pt NPO for anticipated surgery today. Pt  insisting on leaving the department/hospital. Security on department. Walking behind patient and nurse Hydrographic surveyor(writer) Asked patient to allow staff to remove IV and VAC dressing. IV removed by patient in the hallway. Wants to keep VAC dressing intact. Attempt to encourage patient to remain in room and review plan for  surgery on today. Patient continues walking out of the department, down to the next department. Security aware of patient's location. Patient denies having any other hospital property.  Writer able to speak with patient again. Patient voices a desire to smoke and have surgery. Pt reminded of non smoking campus and the plan for surgery today. Agreeable to return to room. Security updated.  0915 patient returns to room.

## 2016-03-28 NOTE — Progress Notes (Signed)
Again patient disconnecting VAC and walking in halls. Reminded patient of risk for infection and the expectation to ambulate on department with VAC intact. Verbalizes understanding. Yet does not comply.  RN converted IV to NSL to discourage patient  from disconnecting. Will continue to monitor.

## 2016-03-29 ENCOUNTER — Encounter (HOSPITAL_COMMUNITY): Payer: Self-pay | Admitting: Certified Registered Nurse Anesthetist

## 2016-03-29 ENCOUNTER — Encounter (HOSPITAL_COMMUNITY): Admission: EM | Disposition: A | Discharge status: Home / Self Care | Payer: Self-pay | Source: Home / Self Care

## 2016-03-29 ENCOUNTER — Inpatient Hospital Stay (HOSPITAL_COMMUNITY): Payer: No Typology Code available for payment source | Admitting: Certified Registered Nurse Anesthetist

## 2016-03-29 HISTORY — PX: FASCIOTOMY CLOSURE: SHX5829

## 2016-03-29 LAB — I-STAT BETA HCG BLOOD, ED (NOT ORDERABLE)

## 2016-03-29 LAB — TYPE AND SCREEN
ABO/RH(D): O POS
ANTIBODY SCREEN: NEGATIVE

## 2016-03-29 SURGERY — FASCIOTOMY CLOSURE
Anesthesia: General | Site: Leg Lower | Laterality: Left

## 2016-03-29 MED ORDER — PHENYLEPHRINE 40 MCG/ML (10ML) SYRINGE FOR IV PUSH (FOR BLOOD PRESSURE SUPPORT)
PREFILLED_SYRINGE | INTRAVENOUS | Status: AC
  Filled 2016-03-29: qty 10

## 2016-03-29 MED ORDER — FENTANYL CITRATE (PF) 100 MCG/2ML IJ SOLN
INTRAMUSCULAR | Status: DC | PRN
  Administered 2016-03-29: 25 ug via INTRAVENOUS

## 2016-03-29 MED ORDER — HYDROMORPHONE HCL 1 MG/ML IJ SOLN: 0.2500 mg | INTRAMUSCULAR | Status: DC | PRN

## 2016-03-29 MED ORDER — LIDOCAINE 2% (20 MG/ML) 5 ML SYRINGE
INTRAMUSCULAR | Status: DC | PRN
  Administered 2016-03-29: 70 mg via INTRAVENOUS

## 2016-03-29 MED ORDER — PHENYLEPHRINE 40 MCG/ML (10ML) SYRINGE FOR IV PUSH (FOR BLOOD PRESSURE SUPPORT)
PREFILLED_SYRINGE | INTRAVENOUS | Status: DC | PRN
  Administered 2016-03-29: 40 ug via INTRAVENOUS
  Administered 2016-03-29 (×2): 80 ug via INTRAVENOUS
  Administered 2016-03-29: 120 ug via INTRAVENOUS
  Administered 2016-03-29: 80 ug via INTRAVENOUS

## 2016-03-29 MED ORDER — 0.9 % SODIUM CHLORIDE (POUR BTL) OPTIME
TOPICAL | Status: DC | PRN
  Administered 2016-03-29: 1000 mL

## 2016-03-29 MED ORDER — MIDAZOLAM HCL 2 MG/2ML IJ SOLN
INTRAMUSCULAR | Status: AC
  Filled 2016-03-29: qty 2

## 2016-03-29 MED ORDER — BACITRACIN ZINC 500 UNIT/GM EX OINT
TOPICAL_OINTMENT | CUTANEOUS | Status: AC
  Filled 2016-03-29: qty 28.35

## 2016-03-29 MED ORDER — PHENYLEPHRINE HCL 10 MG/ML IJ SOLN
INTRAVENOUS | Status: DC | PRN
  Administered 2016-03-29: 15 ug/min via INTRAVENOUS

## 2016-03-29 MED ORDER — EPHEDRINE SULFATE 50 MG/ML IJ SOLN
INTRAMUSCULAR | Status: DC | PRN
  Administered 2016-03-29 (×3): 5 mg via INTRAVENOUS

## 2016-03-29 MED ORDER — EPHEDRINE 5 MG/ML INJ
INTRAVENOUS | Status: AC
  Filled 2016-03-29: qty 10

## 2016-03-29 MED ORDER — ONDANSETRON HCL 4 MG/2ML IJ SOLN
INTRAMUSCULAR | Status: DC | PRN
  Administered 2016-03-29: 4 mg via INTRAVENOUS

## 2016-03-29 MED ORDER — PROPOFOL 10 MG/ML IV BOLUS
INTRAVENOUS | Status: AC
  Filled 2016-03-29: qty 20

## 2016-03-29 MED ORDER — FENTANYL CITRATE (PF) 100 MCG/2ML IJ SOLN
INTRAMUSCULAR | Status: AC
  Filled 2016-03-29: qty 4

## 2016-03-29 MED ORDER — LACTATED RINGERS IV SOLN
INTRAVENOUS | Status: DC | PRN
  Administered 2016-03-29: 07:00:00 via INTRAVENOUS

## 2016-03-29 MED ORDER — LIDOCAINE 2% (20 MG/ML) 5 ML SYRINGE
INTRAMUSCULAR | Status: AC
  Filled 2016-03-29: qty 5

## 2016-03-29 MED ORDER — MIDAZOLAM HCL 2 MG/2ML IJ SOLN
INTRAMUSCULAR | Status: DC | PRN
  Administered 2016-03-29: 2 mg via INTRAVENOUS

## 2016-03-29 MED ORDER — ONDANSETRON HCL 4 MG/2ML IJ SOLN
INTRAMUSCULAR | Status: AC
  Filled 2016-03-29: qty 2

## 2016-03-29 MED ORDER — PROPOFOL 10 MG/ML IV BOLUS
INTRAVENOUS | Status: DC | PRN
  Administered 2016-03-29: 180 mg via INTRAVENOUS

## 2016-03-29 MED ORDER — CEFAZOLIN IN D5W 1 GM/50ML IV SOLN
INTRAVENOUS | Status: DC | PRN
  Administered 2016-03-29: 1 g via INTRAVENOUS

## 2016-03-29 MED ORDER — BACITRACIN ZINC 500 UNIT/GM EX OINT
TOPICAL_OINTMENT | CUTANEOUS | Status: DC | PRN
  Administered 2016-03-29: 1 via TOPICAL

## 2016-03-29 SURGICAL SUPPLY — 32 items
BAG ISOLATION DRAPE 18X18 (DRAPES) ×1 IMPLANT
BANDAGE ELASTIC 4 VELCRO ST LF (GAUZE/BANDAGES/DRESSINGS) ×6 IMPLANT
BNDG GAUZE ELAST 4 BULKY (GAUZE/BANDAGES/DRESSINGS) ×3 IMPLANT
CANISTER SUCT 3000ML PPV (MISCELLANEOUS) ×3 IMPLANT
COVER SURGICAL LIGHT HANDLE (MISCELLANEOUS) ×3 IMPLANT
DRAPE ISOLATION BAG 18X18 (DRAPES) ×2
DRAPE ORTHO SPLIT 77X108 STRL (DRAPES) ×2
DRAPE SURG ORHT 6 SPLT 77X108 (DRAPES) ×1 IMPLANT
DRSG ADAPTIC 3X8 NADH LF (GAUZE/BANDAGES/DRESSINGS) ×3 IMPLANT
ELECT CAUTERY BLADE 6.4 (BLADE) ×3 IMPLANT
ELECT REM PT RETURN 9FT ADLT (ELECTROSURGICAL) ×3
ELECTRODE REM PT RTRN 9FT ADLT (ELECTROSURGICAL) ×1 IMPLANT
GAUZE SPONGE 4X4 12PLY STRL (GAUZE/BANDAGES/DRESSINGS) ×3 IMPLANT
GLOVE BIO SURGEON STRL SZ7.5 (GLOVE) ×3 IMPLANT
GLOVE BIOGEL PI IND STRL 6.5 (GLOVE) ×2 IMPLANT
GLOVE BIOGEL PI IND STRL 8 (GLOVE) ×1 IMPLANT
GLOVE BIOGEL PI INDICATOR 6.5 (GLOVE) ×4
GLOVE BIOGEL PI INDICATOR 8 (GLOVE) ×2
GLOVE SURG SS PI 6.5 STRL IVOR (GLOVE) ×6 IMPLANT
GOWN BRE IMP SLV AUR LG STRL (GOWN DISPOSABLE) ×9 IMPLANT
GOWN STRL REUS W/ TWL LRG LVL3 (GOWN DISPOSABLE) ×4 IMPLANT
GOWN STRL REUS W/TWL LRG LVL3 (GOWN DISPOSABLE) ×8
KIT BASIN OR (CUSTOM PROCEDURE TRAY) ×3 IMPLANT
KIT ROOM TURNOVER OR (KITS) ×3 IMPLANT
NS IRRIG 1000ML POUR BTL (IV SOLUTION) ×3 IMPLANT
PACK GENERAL/GYN (CUSTOM PROCEDURE TRAY) ×3 IMPLANT
PACK UNIVERSAL I (CUSTOM PROCEDURE TRAY) ×3 IMPLANT
PAD ARMBOARD 7.5X6 YLW CONV (MISCELLANEOUS) ×3 IMPLANT
SPONGE GAUZE 4X4 12PLY STER LF (GAUZE/BANDAGES/DRESSINGS) ×3 IMPLANT
SUT ETHILON 3 0 PS 1 (SUTURE) ×27 IMPLANT
SYR BULB IRRIGATION 50ML (SYRINGE) ×3 IMPLANT
WATER STERILE IRR 1000ML POUR (IV SOLUTION) ×3 IMPLANT

## 2016-03-29 NOTE — Anesthesia Procedure Notes (Signed)
Procedure Name: LMA Insertion Date/Time: 03/29/2016 7:45 AM Performed by: Rise PatienceBELL, Eyva Califano T Pre-anesthesia Checklist: Patient identified, Emergency Drugs available, Suction available and Patient being monitored Patient Re-evaluated:Patient Re-evaluated prior to inductionOxygen Delivery Method: Circle System Utilized Preoxygenation: Pre-oxygenation with 100% oxygen Intubation Type: IV induction Ventilation: Mask ventilation without difficulty LMA: LMA inserted LMA Size: 3.0 Number of attempts: 1 Airway Equipment and Method: Bite block Placement Confirmation: positive ETCO2 and breath sounds checked- equal and bilateral Tube secured with: Tape Dental Injury: Teeth and Oropharynx as per pre-operative assessment

## 2016-03-29 NOTE — Interval H&P Note (Signed)
History and Physical Interval Note:  03/29/2016 7:28 AM  Jamie Kirk  has presented today for surgery, with the diagnosis of Left lower extremity compartment syndrome T79.8XXD  The various methods of treatment have been discussed with the patient and family. After consideration of risks, benefits and other options for treatment, the patient has consented to  Procedure(s): LOWER EXTREMITY FASCIOTOMY CLOSURE (Left) as a surgical intervention .  The patient's history has been reviewed, patient examined, no change in status, stable for surgery.  I have reviewed the patient's chart and labs.  Questions were answered to the patient's satisfaction.     Waverly Ferrariickson, Madoline Bhatt

## 2016-03-29 NOTE — Op Note (Signed)
    NAME: Jamie Kirk   MRN: [id number] DOB: 1985/12/16    DATE OF OPERATION: 03/29/2016  PREOP DIAGNOSIS: STATUS POST 4 COMPARTMENT FASCIOTOMY LEFT LEG:  POSTOP DIAGNOSIS: Same  PROCEDURE: Closure of medial and lateral fasciotomy incisions  SURGEON: Di Kindle. Edilia Bo, MD, FACS  ASSIST: Dorcas Carrow, RNFA   ANESTHESIA: Gen.   EBL: Minimal  INDICATIONS: Jamie Kirk is a 31 y.o. female who underwent a 4 compartment fasciotomy 5 days ago and presents for closure of her wounds.  FINDINGS: Moderate tension and to completion.  TECHNIQUE: The patient was brought to the operating room and received a general anesthetic. The left leg was prepped and draped in usual sterile fashion. Both of the wounds were irrigated with saline. The medial wound appeared to be under less tension. This was closed with interrupted 3-0 nylon vertical mattress sutures. Next attention was turned to the lateral wound. I was also able to get this closed with interrupted 3-0 nylon vertical mattress sutures. A sterile dressing was applied. The patient tolerated the procedure well and was transferred to the recovery room in stable condition. All needle and sponge counts were correct.  Waverly Ferrari, MD, FACS [facility]  DATE OF DICTATION:   03/29/2016
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
## 2016-03-29 NOTE — Transfer of Care (Signed)
Immediate Anesthesia Transfer of Care Note  Patient: Jamie Kirk  Procedure(s) Performed: Procedure(s): LOWER EXTREMITY FASCIOTOMY CLOSURE x2 (Left)  Patient Location: PACU  Anesthesia Type:General  Level of Consciousness: awake, alert  and oriented  Airway & Oxygen Therapy: Patient Spontanous Breathing  Post-op Assessment: Report given to RN, Post -op Vital signs reviewed and stable and Patient moving all extremities X 4  Post vital signs: Reviewed and stable  Last Vitals:  Vitals:   03/28/16 1603 03/29/16 0517  BP: (!) 143/69 (!) 148/77  Pulse: 91 61  Resp: 16 18  Temp: 36.8 C 37.2 C    Last Pain:  Vitals:   03/29/16 0526  TempSrc:   PainSc: 8       Patients Stated Pain Goal: 2 (03/29/16 0526)  Complications: No apparent anesthesia complications

## 2016-03-29 NOTE — H&P (View-Only) (Signed)
Pt fasciotomy rescheduled for tomorrow Tuesday 3/13  NPO p midnight  Jamie Kirk 
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
## 2016-03-29 NOTE — Anesthesia Postprocedure Evaluation (Signed)
Anesthesia Post Note  Patient: Jamie Kirk  Procedure(s) Performed: Procedure(s) (LRB): LOWER EXTREMITY FASCIOTOMY CLOSURE x2 (Left)  Patient location during evaluation: PACU Anesthesia Type: General Level of consciousness: awake Pain management: pain level controlled Vital Signs Assessment: post-procedure vital signs reviewed and stable Respiratory status: spontaneous breathing Cardiovascular status: stable Anesthetic complications: no       Last Vitals:  Vitals:   03/29/16 0915 03/29/16 0930  BP: (!) 149/73 (!) 142/70  Pulse: (!) 101 96  Resp: 11 14  Temp:  36.7 C    Last Pain:  Vitals:   03/29/16 1004  TempSrc:   PainSc: 10-Worst pain ever                 Deriana Vanderhoef

## 2016-03-29 NOTE — Anesthesia Preprocedure Evaluation (Addendum)
Anesthesia Evaluation  Patient identified by MRN, date of birth, ID band Patient awake    Reviewed: Allergy & Precautions, NPO status , Patient's Chart, lab work & pertinent test results  Airway Mallampati: III  TM Distance: >3 FB Neck ROM: Full    Dental  (+) Dental Advisory Given, Missing, Poor Dentition,    Pulmonary Current Smoker,    breath sounds clear to auscultation       Cardiovascular negative cardio ROS   Rhythm:Regular Rate:Normal     Neuro/Psych    GI/Hepatic negative GI ROS, Neg liver ROS, (+)     substance abuse  cocaine use,   Endo/Other  negative endocrine ROS  Renal/GU negative Renal ROS     Musculoskeletal   Abdominal   Peds  Hematology   Anesthesia Other Findings   Reproductive/Obstetrics                          Anesthesia Physical Anesthesia Plan  ASA: II  Anesthesia Plan: General   Post-op Pain Management:    Induction: Intravenous  Airway Management Planned: LMA  Additional Equipment:   Intra-op Plan:   Post-operative Plan: Extubation in OR  Informed Consent: I have reviewed the patients History and Physical, chart, labs and discussed the procedure including the risks, benefits and alternatives for the proposed anesthesia with the patient or authorized representative who has indicated his/her understanding and acceptance.   Dental advisory given  Plan Discussed with: CRNA and Anesthesiologist  Anesthesia Plan Comments:        Anesthesia Quick Evaluation
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
## 2016-03-30 ENCOUNTER — Encounter (HOSPITAL_COMMUNITY): Payer: Self-pay | Admitting: Vascular Surgery

## 2016-03-30 MED ORDER — ACETAMINOPHEN 325 MG PO TABS: 325.0000 mg | ORAL_TABLET | ORAL | Status: DC | PRN

## 2016-03-30 MED ORDER — OXYCODONE HCL 15 MG PO TABS: 15.0000 mg | ORAL_TABLET | ORAL | 0 refills | Status: DC | PRN

## 2016-03-30 NOTE — Progress Notes (Signed)
[facility]  Subjective  - pt feels better   Objective 122/78 (!) 111 99.7 F (37.6 C) (Oral) 18 99%  Intake/Output Summary (Last 24 hours) at 03/30/16 16100822 Last data filed at 03/30/16 0107  Gross per 24 hour  Intake             1870 ml  Output              470 ml  Net             1400 ml   left leg dressing clean Mild left foot drop  Assessment/Planning: Ok for d/c today F/u 2 weeks for suture removal PT for foot drop Ok to remove dressing tomorrow  Jamie Kirk, Jamie Kirk 03/30/2016 8:22 AM --  Laboratory Lab Results:  Recent Labs  03/28/16 0325  WBC 9.2  HGB 10.1*  HCT 30.9*  PLT 151   BMET  Recent Labs  03/28/16 0325  NA 137  K 3.9  CL 94*  CO2 35*  GLUCOSE 118*  BUN 6  CREATININE 0.89  CALCIUM 9.4    COAG Lab Results  Component Value Date   INR 0.95 03/28/2016   INR 1.31 03/24/2016   INR 1.29 03/24/2016   No results found for: PTT
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
## 2016-03-30 NOTE — Discharge Instructions (Signed)
KEEP LOWER LEG WOUNDS CLEAN AND DRY. YOU MAY SHOWER DAILY WITH MILD SOAP AND WATER BUT DO NOT SOAK IN A BATHTUB, AS SOAKING THESE WOUNDS MAY CAUSE INFECTION.   SCHEDULE AN APPOINTMENT WITH VASCULAR SURGERY (FIELDS) TO HAVE YOUR SUTURES/STITCHES REMOVED.   TAKE PAIN MEDICATION AS PRESCRIBED FOR PAIN AND GRADUALLY TRY TO DECREASE NARCOTIC MEDICATIONS AND TAKE TYLENOL. DO NOT EXCEED 3,000 MG OF TYLENOL DAILY.      Fasciotomy for Compartment Syndrome, Care After Refer to this sheet in the next few weeks. These instructions provide you with information about caring for yourself after your procedure. Your health care provider may also give you more specific instructions. Your treatment has been planned according to current medical practices, but problems sometimes occur. Call your health care provider if you have any problems or questions after your procedure. What can I expect after the procedure? After the procedure, it is common to have:  Pain.  Itching.  Stiffness.  Numbness or tingling.  Clear or yellow fluid draining from the incision. Follow these instructions at home: Incision care   Follow instructions from your surgeon about:  How to take care of your incision.  When and how you should change your bandage (dressing). If your incision is open, a wound care specialist may come to your home to change the dressing.  When you should remove your dressing.  Keep the incision area dry. Do not take baths, swim, or use a hot tub until your surgeon approves. Ask your surgeon when you can take a shower.  Check your incision every day for signs of infection. Watch for:  Redness, swelling, or pain.  Pus or blood.  Fluid drainage that continues for longer than one week. Managing pain, stiffness, and swelling   Move your fingers or toes often to avoid stiffness and swelling.  Raise (elevate) the injured area above the level of your heart as much as possible. Activity   Ask your  surgeon what activities are safe for you.  If the procedure was done on your leg or foot, do not stand or walk until your surgeon says you may.  If the procedure was done on your hand or arm, avoid any activity that involves lifting, carrying, gripping, or twisting until your surgeon says you may. General instructions   Take over-the-counter and prescription medicines only as told by your surgeon. Do not take aspirin or other NSAIDs unless your surgeon has approved.These types of medicine increase your risk of bleeding.  Keep all follow-up visits as told by your surgeon. This is important.  You may need to see a physical therapist or occupational therapist. This will help your recovery. Do any exercises as instructed by your physical or occupational therapist.  Do not use any tobacco products, including cigarettes, chewing tobacco, or e-cigarettes. Tobacco can delay healing. If you need help quitting, ask your health care provider. Contact a health care provider if:   You have a fever.  Your pain is not controlled with medicine.  Your incision becomes red, swollen, or painful.  You have fluid, including blood or pus, coming from your incision for longer than one week.  You feel nauseous or vomit and it does not go away. Get help right away if:  You have increased swelling in your arm or leg.  You have increasing or severe pain.  You have chest pain.  You have trouble breathing.  You lose feeling in your arm or leg. This information is not intended to replace  advice given to you by your health care provider. Make sure you discuss any questions you have with your health care provider. Document Released: 04/01/2008 Document Revised: 06/11/2015 Document Reviewed: 05/19/2014 Elsevier Interactive Patient Education  2017 [employer]
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
## 2016-03-30 NOTE — Progress Notes (Signed)
Pt discharged home in stable condition after going over discharge instructions with no concerns voiced. AVS and discharge script given before leaving unit. Pt left unit angry that she could not be wheeled out of the unit fast enough
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
## 2016-08-03 ENCOUNTER — Other Ambulatory Visit: Payer: Self-pay

## 2016-08-03 ENCOUNTER — Observation Stay (HOSPITAL_COMMUNITY): Payer: Self-pay

## 2016-08-03 ENCOUNTER — Emergency Department (HOSPITAL_COMMUNITY): Payer: Self-pay

## 2016-08-03 ENCOUNTER — Emergency Department (HOSPITAL_COMMUNITY)
Admission: EM | Admit: 2016-08-03 | Discharge: 2016-08-03 | Disposition: A | Discharge status: Home / Self Care | Payer: Self-pay

## 2016-08-03 ENCOUNTER — Encounter (HOSPITAL_COMMUNITY): Payer: Self-pay | Admitting: *Deleted

## 2016-08-03 ENCOUNTER — Inpatient Hospital Stay (HOSPITAL_COMMUNITY)
Admission: EM | Admit: 2016-08-03 | Discharge: 2016-08-06 | DRG: 690 | Disposition: A | Discharge status: Home / Self Care | Payer: Self-pay | Attending: Internal Medicine | Admitting: Internal Medicine

## 2016-08-03 DIAGNOSIS — E876 Hypokalemia: Secondary | ICD-10-CM | POA: Diagnosis present

## 2016-08-03 DIAGNOSIS — R9431 Abnormal electrocardiogram [ECG] [EKG]: Secondary | ICD-10-CM | POA: Diagnosis present

## 2016-08-03 DIAGNOSIS — R0682 Tachypnea, not elsewhere classified: Secondary | ICD-10-CM | POA: Diagnosis present

## 2016-08-03 DIAGNOSIS — F1910 Other psychoactive substance abuse, uncomplicated: Secondary | ICD-10-CM | POA: Diagnosis present

## 2016-08-03 DIAGNOSIS — R0789 Other chest pain: Secondary | ICD-10-CM

## 2016-08-03 DIAGNOSIS — I201 Angina pectoris with documented spasm: Secondary | ICD-10-CM | POA: Diagnosis present

## 2016-08-03 DIAGNOSIS — M79601 Pain in right arm: Secondary | ICD-10-CM | POA: Diagnosis present

## 2016-08-03 DIAGNOSIS — F14188 Cocaine abuse with other cocaine-induced disorder: Secondary | ICD-10-CM | POA: Diagnosis present

## 2016-08-03 DIAGNOSIS — N390 Urinary tract infection, site not specified: Principal | ICD-10-CM | POA: Diagnosis present

## 2016-08-03 DIAGNOSIS — R509 Fever, unspecified: Secondary | ICD-10-CM

## 2016-08-03 DIAGNOSIS — R000 Tachycardia, unspecified: Secondary | ICD-10-CM | POA: Diagnosis present

## 2016-08-03 DIAGNOSIS — D649 Anemia, unspecified: Secondary | ICD-10-CM | POA: Diagnosis present

## 2016-08-03 DIAGNOSIS — F19188 Other psychoactive substance abuse with other psychoactive substance-induced disorder: Secondary | ICD-10-CM | POA: Diagnosis present

## 2016-08-03 DIAGNOSIS — Z590 Homelessness: Secondary | ICD-10-CM

## 2016-08-03 DIAGNOSIS — F17210 Nicotine dependence, cigarettes, uncomplicated: Secondary | ICD-10-CM | POA: Diagnosis present

## 2016-08-03 DIAGNOSIS — E871 Hypo-osmolality and hyponatremia: Secondary | ICD-10-CM | POA: Diagnosis present

## 2016-08-03 DIAGNOSIS — B9620 Unspecified Escherichia coli [E. coli] as the cause of diseases classified elsewhere: Secondary | ICD-10-CM | POA: Diagnosis present

## 2016-08-03 DIAGNOSIS — A419 Sepsis, unspecified organism: Secondary | ICD-10-CM | POA: Diagnosis present

## 2016-08-03 DIAGNOSIS — R091 Pleurisy: Secondary | ICD-10-CM | POA: Diagnosis present

## 2016-08-03 DIAGNOSIS — F1110 Opioid abuse, uncomplicated: Secondary | ICD-10-CM | POA: Diagnosis present

## 2016-08-03 HISTORY — DX: Drug use complicating pregnancy, unspecified trimester: O99.320

## 2016-08-03 HISTORY — DX: Other psychoactive substance abuse, uncomplicated: F19.10

## 2016-08-03 LAB — CBC WITH DIFFERENTIAL/PLATELET
BASOS PCT: 0 %
Basophils Absolute: 0 10*3/uL (ref 0.0–0.1)
EOS ABS: 0 10*3/uL (ref 0.0–0.7)
Eosinophils Relative: 0 %
HCT: 31.6 % — ABNORMAL LOW (ref 36.0–46.0)
HEMOGLOBIN: 10.5 g/dL — AB (ref 12.0–15.0)
LYMPHS ABS: 0.9 10*3/uL (ref 0.7–4.0)
Lymphocytes Relative: 10 %
MCH: 30.1 pg (ref 26.0–34.0)
MCHC: 33.2 g/dL (ref 30.0–36.0)
MCV: 90.5 fL (ref 78.0–100.0)
Monocytes Absolute: 0.1 10*3/uL (ref 0.1–1.0)
Monocytes Relative: 1 %
NEUTROS ABS: 7.7 10*3/uL (ref 1.7–7.7)
NEUTROS PCT: 89 %
Platelets: 250 10*3/uL (ref 150–400)
RBC: 3.49 MIL/uL — AB (ref 3.87–5.11)
RDW: 14.7 % (ref 11.5–15.5)
WBC: 8.7 10*3/uL (ref 4.0–10.5)

## 2016-08-03 LAB — URINALYSIS, ROUTINE W REFLEX MICROSCOPIC
BILIRUBIN URINE: NEGATIVE
GLUCOSE, UA: NEGATIVE mg/dL
KETONES UR: 5 mg/dL — AB
Leukocytes, UA: NEGATIVE
NITRITE: POSITIVE — AB
PROTEIN: NEGATIVE mg/dL
Specific Gravity, Urine: 1.013 (ref 1.005–1.030)
pH: 5 (ref 5.0–8.0)

## 2016-08-03 LAB — COMPREHENSIVE METABOLIC PANEL
ALT: 14 U/L (ref 14–54)
ANION GAP: 6 (ref 5–15)
AST: 23 U/L (ref 15–41)
Albumin: 3.4 g/dL — ABNORMAL LOW (ref 3.5–5.0)
Alkaline Phosphatase: 68 U/L (ref 38–126)
BUN: 12 mg/dL (ref 6–20)
CALCIUM: 8.8 mg/dL — AB (ref 8.9–10.3)
CHLORIDE: 103 mmol/L (ref 101–111)
CO2: 25 mmol/L (ref 22–32)
Creatinine, Ser: 1.1 mg/dL — ABNORMAL HIGH (ref 0.44–1.00)
Glucose, Bld: 100 mg/dL — ABNORMAL HIGH (ref 65–99)
Potassium: 3.1 mmol/L — ABNORMAL LOW (ref 3.5–5.1)
Sodium: 134 mmol/L — ABNORMAL LOW (ref 135–145)
Total Bilirubin: 1.2 mg/dL (ref 0.3–1.2)
Total Protein: 6.7 g/dL (ref 6.5–8.1)

## 2016-08-03 LAB — PROTIME-INR
INR: 1.27
PROTHROMBIN TIME: 16 s — AB (ref 11.4–15.2)

## 2016-08-03 LAB — LACTIC ACID, PLASMA
LACTIC ACID, VENOUS: 1.4 mmol/L (ref 0.5–1.9)
Lactic Acid, Venous: 1 mmol/L (ref 0.5–1.9)

## 2016-08-03 LAB — RAPID URINE DRUG SCREEN, HOSP PERFORMED
Amphetamines: NOT DETECTED
Barbiturates: NOT DETECTED
Benzodiazepines: NOT DETECTED
COCAINE: POSITIVE — AB
OPIATES: NOT DETECTED
TETRAHYDROCANNABINOL: NOT DETECTED

## 2016-08-03 LAB — I-STAT BETA HCG BLOOD, ED (MC, WL, AP ONLY): I-stat hCG, quantitative: <5 m[IU]/mL (ref <5)

## 2016-08-03 LAB — I-STAT CG4 LACTIC ACID, ED: LACTIC ACID, VENOUS: 0.95 mmol/L (ref 0.5–1.9)

## 2016-08-03 LAB — APTT: aPTT: 31 seconds (ref 24–36)

## 2016-08-03 LAB — PROCALCITONIN: PROCALCITONIN: 3.94 ng/mL

## 2016-08-03 LAB — I-STAT TROPONIN, ED: TROPONIN I, POC: 0.01 ng/mL (ref 0.00–0.08)

## 2016-08-03 LAB — LIPASE, BLOOD: Lipase: 21 U/L (ref 11–51)

## 2016-08-03 LAB — TROPONIN I

## 2016-08-03 MED ORDER — ZOLPIDEM TARTRATE 5 MG PO TABS: 5.0000 mg | ORAL_TABLET | Freq: Every evening | ORAL | Status: DC | PRN

## 2016-08-03 MED ORDER — ADULT MULTIVITAMIN W/MINERALS CH
1.0000 | ORAL_TABLET | Freq: Every day | ORAL | Status: DC
  Administered 2016-08-03 – 2016-08-04 (×2): 1 via ORAL
  Filled 2016-08-03 (×2): qty 1

## 2016-08-03 MED ORDER — ONDANSETRON HCL 4 MG PO TABS: 4.0000 mg | ORAL_TABLET | Freq: Four times a day (QID) | ORAL | Status: DC | PRN

## 2016-08-03 MED ORDER — ACETAMINOPHEN 500 MG PO TABS
1000.0000 mg | ORAL_TABLET | Freq: Once | ORAL | Status: AC
  Administered 2016-08-03: 1000 mg via ORAL
  Filled 2016-08-03: qty 2

## 2016-08-03 MED ORDER — ACETAMINOPHEN 325 MG PO TABS: 650.0000 mg | ORAL_TABLET | Freq: Four times a day (QID) | ORAL | Status: DC | PRN

## 2016-08-03 MED ORDER — VITAMIN B-1 100 MG PO TABS
100.0000 mg | ORAL_TABLET | Freq: Every day | ORAL | Status: DC
  Administered 2016-08-03 – 2016-08-04 (×2): 100 mg via ORAL
  Filled 2016-08-03 (×2): qty 1

## 2016-08-03 MED ORDER — SODIUM CHLORIDE 0.9% FLUSH: 3.0000 mL | Freq: Two times a day (BID) | INTRAVENOUS | Status: DC

## 2016-08-03 MED ORDER — PIPERACILLIN-TAZOBACTAM 3.375 G IVPB 30 MIN
3.3750 g | Freq: Three times a day (TID) | INTRAVENOUS | Status: DC
  Administered 2016-08-03 – 2016-08-04 (×4): 3.375 g via INTRAVENOUS
  Filled 2016-08-03 (×8): qty 50

## 2016-08-03 MED ORDER — ACETAMINOPHEN 650 MG RE SUPP: 650.0000 mg | Freq: Four times a day (QID) | RECTAL | Status: DC | PRN

## 2016-08-03 MED ORDER — POTASSIUM CHLORIDE CRYS ER 20 MEQ PO TBCR
40.0000 meq | EXTENDED_RELEASE_TABLET | ORAL | Status: AC
  Administered 2016-08-03: 40 meq via ORAL
  Filled 2016-08-03: qty 2

## 2016-08-03 MED ORDER — SODIUM CHLORIDE 0.9 % IV SOLN
1000.0000 mL | INTRAVENOUS | Status: DC
  Administered 2016-08-03 (×2): 1000 mL via INTRAVENOUS

## 2016-08-03 MED ORDER — ONDANSETRON HCL 4 MG/2ML IJ SOLN: 4.0000 mg | Freq: Four times a day (QID) | INTRAMUSCULAR | Status: DC | PRN

## 2016-08-03 MED ORDER — SENNOSIDES-DOCUSATE SODIUM 8.6-50 MG PO TABS: 1.0000 | ORAL_TABLET | Freq: Every evening | ORAL | Status: DC | PRN

## 2016-08-03 MED ORDER — LORAZEPAM 2 MG/ML IJ SOLN
1.0000 mg | Freq: Once | INTRAMUSCULAR | Status: AC
  Administered 2016-08-03: 1 mg via INTRAVENOUS
  Filled 2016-08-03: qty 1

## 2016-08-03 MED ORDER — SODIUM CHLORIDE 0.9 % IV SOLN
INTRAVENOUS | Status: DC
Start: 2016-08-03 — End: 2016-08-06
  Administered 2016-08-03 (×2): via INTRAVENOUS

## 2016-08-03 MED ORDER — ENOXAPARIN SODIUM 40 MG/0.4ML ~~LOC~~ SOLN
40.0000 mg | SUBCUTANEOUS | Status: DC
  Administered 2016-08-03 – 2016-08-05 (×3): 40 mg via SUBCUTANEOUS
  Filled 2016-08-03 (×4): qty 0.4

## 2016-08-03 MED ORDER — SODIUM CHLORIDE 0.9 % IV BOLUS (SEPSIS)
1000.0000 mL | Freq: Once | INTRAVENOUS | Status: AC
  Administered 2016-08-03: 1000 mL via INTRAVENOUS

## 2016-08-03 MED ORDER — POTASSIUM CHLORIDE CRYS ER 20 MEQ PO TBCR
40.0000 meq | EXTENDED_RELEASE_TABLET | Freq: Once | ORAL | Status: DC
  Filled 2016-08-03: qty 2

## 2016-08-03 MED ORDER — FOLIC ACID 1 MG PO TABS
1.0000 mg | ORAL_TABLET | Freq: Every day | ORAL | Status: DC
  Administered 2016-08-03 – 2016-08-04 (×2): 1 mg via ORAL
  Filled 2016-08-03 (×2): qty 1

## 2016-08-03 MED ORDER — SODIUM CHLORIDE 0.9 % IV BOLUS (SEPSIS)
250.0000 mL | Freq: Once | INTRAVENOUS | Status: AC
  Administered 2016-08-03: 250 mL via INTRAVENOUS

## 2016-08-03 MED ORDER — VANCOMYCIN HCL IN DEXTROSE 1-5 GM/200ML-% IV SOLN
1000.0000 mg | Freq: Two times a day (BID) | INTRAVENOUS | Status: DC
  Administered 2016-08-03 – 2016-08-04 (×3): 1000 mg via INTRAVENOUS
  Filled 2016-08-03 (×5): qty 200

## 2016-08-03 NOTE — ED Notes (Signed)
Pt being transported to another room at this time.

## 2016-08-03 NOTE — ED Notes (Signed)
Meal Tray given 
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
## 2016-08-03 NOTE — Progress Notes (Signed)
CSW attempted to speak with pt again at bedside. Pt was asleep and not waking up with CSW speaking or knocking. CSW will communicate needs to fellow inpatient social worker.   Claude MangesKierra S. Junko Ohagan, MSW, LCSW-A Emergency Department Clinical Social Worker [phone]

## 2016-08-03 NOTE — ED Notes (Signed)
Phlebotomy at the bedside  
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
## 2016-08-03 NOTE — ED Notes (Signed)
Xray at the bedside.

## 2016-08-03 NOTE — Progress Notes (Signed)
Patient refusing labs, assessments, tele, and PO medications; stating she wants to be left alone to sleep.  Patient reports chest pain but refused to rate the pain or give indicators.  NP Schorr notified and instructed RN to continue to monitor patient.  Will continue to monitor and notify as needed.

## 2016-08-03 NOTE — Progress Notes (Signed)
Pharmacy Antibiotic Note  Farrel DemarkCourtney Qualls is a 31 y.o. female admitted on 08/03/2016 with sepsis.  Pharmacy has been consulted for Vancomycin/Zosyn dosing. Hx IVDA here with CP. ?source bacteremia/endocarditis. To start broad spectrum anti-biotics. WBC WNL. Renal function OK.   Plan: Vancomycin 1000 mg IV q12h Zosyn 3.375G IV q8h to be infused over 4 hours Trend WBC, temp, renal function  F/U infectious work-up Drug levels as indicated  Temp (24hrs), Avg:101.5 F (38.6 C), Min:101.5 F (38.6 C), Max:101.5 F (38.6 C)   Recent Labs Lab 08/03/16 0355 08/03/16 0408  WBC 8.7  --   CREATININE 1.10*  --   LATICACIDVEN  --  0.95    CrCl cannot be calculated (Unknown ideal weight.).    No Known Allergies .  Abran DukeLedford, Teon Hudnall 08/03/2016 6:11 AM

## 2016-08-03 NOTE — ED Provider Notes (Addendum)
TIME SEEN: 3:32 AM  CHIEF COMPLAINT: Chest pain  HPI: Patient is a 31 year old female with history of IV here when abuse and crack cocaine use who presents emergency department with chest pain that started today just prior to arrival. He states that several hours previous she smoked crack cocaine and then several hours previous to that had used IV heroin. States pain is a tightness that radiates down her left arm. She is also having body aches and chills. No nausea, vomiting or diarrhea. No dysuria or hematuria. No rash. No tick bites. Patient found to have a fever in the emergency department. She denies having fever recently. She states she has never had chest pain when she is use crack cocaine before and she has been smoking crack cocaine for 6 years.  ROS: See HPI Constitutional:  fever  Eyes: no drainage  ENT: no runny nose   Cardiovascular:   chest pain  Resp: no SOB  GI: no vomiting GU: no dysuria Integumentary: no rash  Allergy: no hives  Musculoskeletal: no leg swelling  Neurological: no slurred speech ROS otherwise negative  PAST MEDICAL HISTORY/PAST SURGICAL HISTORY:  History reviewed. No pertinent past medical history.  MEDICATIONS:  Prior to Admission medications   Medication Sig Start Date End Date Taking? Authorizing Provider  acetaminophen (TYLENOL) 325 MG tablet Take 1-2 tablets (325-650 mg total) by mouth every 4 (four) hours as needed for mild pain (or temp >/= 101 F). 03/30/16   Adam Phenix, PA-C  oxyCODONE (ROXICODONE) 15 MG immediate release tablet Take 1 tablet (15 mg total) by mouth every 4 (four) hours as needed for severe pain. 03/30/16   Adam Phenix, PA-C    ALLERGIES:  No Known Allergies  SOCIAL HISTORY:  Social History  Substance Use Topics  . Smoking status: Current Every Day Smoker    Packs/day: 0.50    Types: Cigarettes  . Smokeless tobacco: Never Used  . Alcohol use Yes    FAMILY HISTORY: No family history on file.  EXAM: BP  131/83 (BP Location: Left Arm)   Pulse 99   Temp (!) 101.5 F (38.6 C) (Oral)   Resp 18   SpO2 100%  CONSTITUTIONAL: Alert and oriented and responds appropriately to questions. Patient is chronically ill-appearing, febrile, nontoxic HEAD: Normocephalic EYES: Conjunctivae clear, pupils appear equal, EOMI ENT: normal nose; moist mucous membranes NECK: Supple, no meningismus, no nuchal rigidity, no LAD  CARD: RRR; S1 and S2 appreciated; no murmurs, no clicks, no rubs, no gallops RESP: Normal chest excursion without splinting or tachypnea; breath sounds clear and equal bilaterally; no wheezes, no rhonchi, no rales, no hypoxia or respiratory distress, speaking full sentences ABD/GI: Normal bowel sounds; non-distended; soft, non-tender, no rebound, no guarding, no peritoneal signs, no hepatosplenomegaly BACK:  The back appears normal and is non-tender to palpation, there is no CVA tenderness EXT: Normal ROM in all joints; non-tender to palpation; no edema; normal capillary refill; no cyanosis, no calf tenderness or swelling    SKIN: Normal color for age and race; warm; no rash NEURO: Moves all extremities equally, normal speech, no facial asymmetry PSYCH: Disheveled, agitated  MEDICAL DECISION MAKING: Patient here with fever. She is adamantly mildly tachycardic and tachypneic. She has no other infectious symptoms. She is complaint of chest pain and difficulty breathing and has a history of IV heroin use. Concern for bacteremia, endocarditis. Will obtain labs, chest x-ray, urine. Will give IV fluids, broad-spectrum antibiotics. We'll give her IV Ativan for her agitation and  likely vasospasm. EKG does not show STEMI.  ED PROGRESS: Patient's labs are unremarkable. Chest x-ray clear. Urine pending. Hemodynamically stable. We'll discuss with medicine for admission.    5:55 AM Discussed patient's case with hospitalist, Dr. Katrinka BlazingSmith.  I have recommended admission and patient (and family if present) agree  with this plan. Admitting physician will place admission orders.   I reviewed all nursing notes, vitals, pertinent previous records, EKGs, lab and urine results, imaging (as available).     Date: 08/03/2016 3:03 AM  Rate: 95  Rhythm: normal sinus rhythm  QRS Axis: normal  Intervals: normal  ST/T Wave abnormalities: Inferior and lateral T wave inversions  Conduction Disutrbances: none  Narrative Interpretation:  Inferior and lateral T wave inversions        Rozalyn Osland, Layla MawKristen N, DO 08/03/16 0613    Leilana Mcquire, Layla MawKristen N, DO 08/03/16 [phone]

## 2016-08-03 NOTE — Progress Notes (Signed)
CSW consulted for substance abuse resources for pt. CSW spoke with pt's RN at desk and was informed that pt may not be able to answer many questions at this time due to current drug use. CSW informed RN that CSW would return a little later today to try and speak with pt if pt is able to. CSW will continue to assist with pt needs as present.   Claude MangesKierra S. Tamantha Saline, MSW, LCSW-A Emergency Department Clinical Social Worker [phone]

## 2016-08-03 NOTE — Progress Notes (Signed)
Patient refuses to let RN connect another IV tubing to infuse 2nd antibiotic, Vancomycin, with currently infusing zosyn, which are compatible.  Patient wants to sleep.  Will infuse vancomycin when zosyn is complete.

## 2016-08-03 NOTE — ED Notes (Signed)
Pt able to use bedside toilet, tolerated well. 

## 2016-08-03 NOTE — ED Notes (Signed)
Ordered Meal Tray at this time for pt.

## 2016-08-03 NOTE — ED Triage Notes (Signed)
C/o anterior chest  Pain onset 1 hour ago , states she uses crack and heroin everyday last used 1 hour ago

## 2016-08-03 NOTE — ED Notes (Signed)
Pt requested this EMT to cut off her shirt because she was unable to take it off due to pain

## 2016-08-03 NOTE — ED Notes (Signed)
Admitting Team at the bedside 

## 2016-08-03 NOTE — Plan of Care (Signed)
Jamie DemarkCourtney Gunn is a  31 y/o Female with pmh of polysubstance abuse( smokes cocaine and injects heroin); who presents shortly after recent use with complaints of CP. EKG showing diffuse T-wave changes, but initial troponin negative.   Vital signs: Temperature 101.41F, respirations 12-22, pulse of 101, and all other vital signs maintained. Labs: WBC 8.7, hbg 10.5, sodium 134, potassium 3.1, creatinine 1.1, lactic acid 0.95  Imaging: CXR shows no acute abnormalities. UA pending.  Sepsis protocol was initiated with full fluid bolus and blood cultures were obtained. Patient was given 1 mg of Ativan, 40 mEq of potassium chloride, and empiric antibiotics of vancomycin and Zosyn. Question possibility of underlying endocarditis. Admitted as observation to a telemetry bed.

## 2016-08-04 ENCOUNTER — Observation Stay (HOSPITAL_BASED_OUTPATIENT_CLINIC_OR_DEPARTMENT_OTHER): Payer: Self-pay

## 2016-08-04 DIAGNOSIS — I339 Acute and subacute endocarditis, unspecified: Secondary | ICD-10-CM

## 2016-08-04 DIAGNOSIS — A4151 Sepsis due to Escherichia coli [E. coli]: Secondary | ICD-10-CM

## 2016-08-04 LAB — ECHOCARDIOGRAM COMPLETE
HEIGHTINCHES: 68 in
WEIGHTICAEL: 2400 [oz_av]

## 2016-08-04 MED ORDER — LORAZEPAM 2 MG/ML IJ SOLN: 1.0000 mg | Freq: Four times a day (QID) | INTRAMUSCULAR | Status: DC | PRN

## 2016-08-04 MED ORDER — TRAMADOL HCL 50 MG PO TABS
50.0000 mg | ORAL_TABLET | Freq: Two times a day (BID) | ORAL | Status: DC | PRN
  Administered 2016-08-04 – 2016-08-05 (×2): 50 mg via ORAL
  Filled 2016-08-04 (×2): qty 1

## 2016-08-04 MED ORDER — HALOPERIDOL 2 MG PO TABS
2.0000 mg | ORAL_TABLET | Freq: Four times a day (QID) | ORAL | Status: DC | PRN
  Administered 2016-08-04: 2 mg via ORAL
  Filled 2016-08-04 (×2): qty 1

## 2016-08-04 MED ORDER — ADULT MULTIVITAMIN W/MINERALS CH
1.0000 | ORAL_TABLET | Freq: Every day | ORAL | Status: DC
  Administered 2016-08-05: 1 via ORAL
  Filled 2016-08-04 (×2): qty 1

## 2016-08-04 MED ORDER — FOLIC ACID 1 MG PO TABS
1.0000 mg | ORAL_TABLET | Freq: Every day | ORAL | Status: DC
  Administered 2016-08-05: 1 mg via ORAL
  Filled 2016-08-04 (×2): qty 1

## 2016-08-04 MED ORDER — LORAZEPAM 1 MG PO TABS
1.0000 mg | ORAL_TABLET | Freq: Four times a day (QID) | ORAL | Status: DC | PRN
  Administered 2016-08-04 (×2): 1 mg via ORAL
  Filled 2016-08-04 (×2): qty 1

## 2016-08-04 MED ORDER — VITAMIN B-1 100 MG PO TABS
100.0000 mg | ORAL_TABLET | Freq: Every day | ORAL | Status: DC
  Administered 2016-08-05: 100 mg via ORAL
  Filled 2016-08-04 (×2): qty 1

## 2016-08-04 MED ORDER — THIAMINE HCL 100 MG/ML IJ SOLN: 100.0000 mg | Freq: Every day | INTRAMUSCULAR | Status: DC

## 2016-08-04 NOTE — Progress Notes (Signed)
IV team RN came to bedside to attempt IV stick but patient refused.  RN educated patient on importance of IV access but patient still refuses.  Will notify on-call coverage and continue to monitor.

## 2016-08-04 NOTE — Progress Notes (Signed)
PROGRESS NOTE    Jamie Kirk  JYN:[id number] DOB: 1985/10/25 DOA: 08/03/2016 PCP: Patient, No Pcp Per   [facility]     Brief Narrative:  Jamie Kirk is a 31 y.o. female who presents with sepsis likely from IVDA and possible bacteremia w/ concurrent CP likely from vasospasm of coronary arteries and pleurisy from inhaling cocaine and other unknkown substances. Sepsis orderset utilized (febrile, tachycardia, tachypnea, possible source, initially somnolent, w/ uptrending lactic acid).   Assessment & Plan:   Active Problems:   Sepsis (HCC)   Sepsis due to UTI blood cultures negative to date -IV abx until urine culture finalized and echo done (currently on vanc/zosyn) -procalcitonin elevated  IV drug abuse -echo to r/o endocarditis  Hypokalemia -replace  Homeless -social work consult  Chest pain ? From cocaine -CE negative -echo pending    DVT prophylaxis:  Lovenox   Code Status: Full Code   Family Communication: Patient  Disposition Plan:     Consultants:      Subjective: C/o pain all over- especially knees  Objective: Vitals:   08/03/16 1500 08/03/16 1655 08/03/16 2237 08/04/16 0505  BP: 137/87 129/82 (!) 137/91 126/84  Pulse: 83 80 72 73  Resp: 18 16 20 18   Temp: 98.7 F (37.1 C) 98.3 F (36.8 C) 98.2 F (36.8 C) 98 F (36.7 C)  TempSrc: Oral     SpO2: 97%  100% 100%  Weight:      Height:        Intake/Output Summary (Last 24 hours) at 08/04/16 1311 Last data filed at 08/04/16 1119  Gross per 24 hour  Intake          3021.25 ml  Output                0 ml  Net          3021.25 ml   Filed Weights   08/03/16 0721  Weight: 68 kg (150 lb)    Examination:  General exam: in bed Respiratory system: Clear to auscultation. Respiratory effort normal. Cardiovascular system: S1 & S2 heard, RRR. No JVD, murmurs, rubs, gallops or clicks. No pedal edema. Gastrointestinal system: Abdomen is nondistended, soft and  nontender. No organomegaly or masses felt. Normal bowel sounds heard. Central nervous system: Alert Extremities: no large knee effusions or erythema Skin: No rashes, lesions or ulcers Psychiatry: poor eye contact and insight into illness    Data Reviewed: I have personally reviewed following labs and imaging studies  CBC:  Recent Labs Lab 08/03/16 0355  WBC 8.7  NEUTROABS 7.7  HGB 10.5*  HCT 31.6*  MCV 90.5  PLT 250   Basic Metabolic Panel:  Recent Labs Lab 08/03/16 0355  NA 134*  K 3.1*  CL 103  CO2 25  GLUCOSE 100*  BUN 12  CREATININE 1.10*  CALCIUM 8.8*   GFR: Estimated Creatinine Clearance: 76.1 mL/min (A) (by C-G formula based on SCr of 1.1 mg/dL (H)). Liver Function Tests:  Recent Labs Lab 08/03/16 0355  AST 23  ALT 14  ALKPHOS 68  BILITOT 1.2  PROT 6.7  ALBUMIN 3.4*    Recent Labs Lab 08/03/16 0355  LIPASE 21   No results for input(s): AMMONIA in the last 168 hours. Coagulation Profile:  Recent Labs Lab 08/03/16 0724  INR 1.27   Cardiac Enzymes:  Recent Labs Lab 08/03/16 0724  TROPONINI <0.03   BNP (last 3 results) No results for input(s): PROBNP in the last 8760 hours. HbA1C: No results for  input(s): HGBA1C in the last 72 hours. CBG: No results for input(s): GLUCAP in the last 168 hours. Lipid Profile: No results for input(s): CHOL, HDL, LDLCALC, TRIG, CHOLHDL, LDLDIRECT in the last 72 hours. Thyroid Function Tests: No results for input(s): TSH, T4TOTAL, FREET4, T3FREE, THYROIDAB in the last 72 hours. Anemia Panel: No results for input(s): VITAMINB12, FOLATE, FERRITIN, TIBC, IRON, RETICCTPCT in the last 72 hours. Urine analysis:    Component Value Date/Time   COLORURINE YELLOW 08/03/2016 0610   APPEARANCEUR CLEAR 08/03/2016 0610   LABSPEC 1.013 08/03/2016 0610   PHURINE 5.0 08/03/2016 0610   GLUCOSEU NEGATIVE 08/03/2016 0610   HGBUR MODERATE (A) 08/03/2016 0610   BILIRUBINUR NEGATIVE 08/03/2016 0610   KETONESUR 5 (A)  08/03/2016 0610   PROTEINUR NEGATIVE 08/03/2016 0610   NITRITE POSITIVE (A) 08/03/2016 0610   LEUKOCYTESUR NEGATIVE 08/03/2016 0610     ) Recent Results (from the past 240 hour(s))  Blood Culture (routine x 2)     Status: None (Preliminary result)   Collection Time: 08/03/16  4:00 AM  Result Value Ref Range Status   Specimen Description BLOOD RIGHT FOREARM  Final   Special Requests IN PEDIATRIC BOTTLE Blood Culture adequate volume  Final   Culture NO GROWTH 1 DAY  Final   Report Status PENDING  Incomplete  Blood Culture (routine x 2)     Status: None (Preliminary result)   Collection Time: 08/03/16  4:15 AM  Result Value Ref Range Status   Specimen Description BLOOD LEFT FOREARM  Final   Special Requests   Final    BOTTLES DRAWN AEROBIC AND ANAEROBIC Blood Culture results may not be optimal due to an inadequate volume of blood received in culture bottles   Culture NO GROWTH 1 DAY  Final   Report Status PENDING  Incomplete  Urine culture     Status: Abnormal (Preliminary result)   Collection Time: 08/03/16  6:10 AM  Result Value Ref Range Status   Specimen Description URINE, RANDOM  Final   Special Requests NONE  Final   Culture (A)  Final    >=100,000 COLONIES/mL ESCHERICHIA COLI SUSCEPTIBILITIES TO FOLLOW    Report Status PENDING  Incomplete      Anti-infectives    Start     Dose/Rate Route Frequency Ordered Stop   08/03/16 0630  piperacillin-tazobactam (ZOSYN) IVPB 3.375 g     3.375 g 12.5 mL/hr over 240 Minutes Intravenous Every 8 hours 08/03/16 0607     08/03/16 0630  vancomycin (VANCOCIN) IVPB 1000 mg/200 mL premix     1,000 mg 200 mL/hr over 60 Minutes Intravenous Every 12 hours 08/03/16 1610         Radiology Studies: Dg Chest 2 View  Result Date: 08/03/2016 CLINICAL DATA:  Chest pain EXAM: CHEST  2 VIEW COMPARISON:  Chest radiograph 03/24/2016 FINDINGS: The heart size and mediastinal contours are within normal limits. Both lungs are clear. The visualized  skeletal structures are unremarkable. IMPRESSION: No active cardiopulmonary disease. Electronically Signed   By: Deatra Robinson M.D.   On: 08/03/2016 04:52   Dg Chest Port 1 View  Result Date: 08/03/2016 CLINICAL DATA:  Sepsis EXAM: PORTABLE CHEST 1 VIEW COMPARISON:  08/03/2016 FINDINGS: Cardiac shadow is at the upper limits of normal in size but accentuated by the portable technique. The lungs are well aerated bilaterally. Previously seen prominent nipple shadows are less well visualized. No bony abnormality is seen. IMPRESSION: No acute abnormality noted. Electronically Signed   By: Alcide Clever  M.D.   On: 08/03/2016 08:26        Scheduled Meds: . enoxaparin (LOVENOX) injection  40 mg Subcutaneous Q24H  . folic acid  1 mg Oral Daily  . multivitamin with minerals  1 tablet Oral Daily  . potassium chloride  40 mEq Oral Once  . sodium chloride flush  3 mL Intravenous Q12H  . thiamine  100 mg Oral Daily   Continuous Infusions: . sodium chloride Stopped (08/03/16 0619)  . sodium chloride 100 mL/hr at 08/03/16 1830  . piperacillin-tazobactam Stopped (08/04/16 0903)  . vancomycin Stopped (08/04/16 1119)     LOS: 0 days    Time spent: 35 min    Kaylen Nghiem U Todrick Siedschlag, DO [facility] Pager [phone]  If 7PM-7AM, please contact night-coverage [email or web address] Password [email or web address] 08/04/2016, 1:11 PM

## 2016-08-04 NOTE — Progress Notes (Signed)
Patient refusing to let RN do physical assessment.  RN educated patient on the importance of continued monitoring. Patient wants to sleep.  Will continue to monitor patient and notify MD as needed.  Will attempt assessment again when patient is agreeable.

## 2016-08-04 NOTE — Progress Notes (Signed)
Patient lost IV access at shift change and only agreeable to let IV team attempt IV stick.  IV team consulted.  Will continue to monitor patient.

## 2016-08-04 NOTE — Progress Notes (Signed)
IVDU admitted with fever, and ?sepsis. Has vanc and Zosyn due, but lost IV and refusing attempt at new access. Has elevated PCT, lactate normal, no leukocytosis or fever. Hemodynamically stable. UA nitrite positive, CXR and echo unremarkable. Counseled by RN, still refusing. Will monitor.

## 2016-08-04 NOTE — Progress Notes (Signed)
Patient allowed NT to put tele monitor back on and allowed RN to do a physical assessment and for vancomycin to be infused.  Patient continues to refuse PO potassium and stated that if her IV beeps again that she will pull it out.  RN educated patient on the importance of taking her antibiotic and why the potassium was ordered but patient continues to refuse potassium and stating that she will pull out IV if it beeps.  Will continue to monitor patient.

## 2016-08-04 NOTE — Progress Notes (Signed)
*  PRELIMINARY RESULTS* Echocardiogram 2D Echocardiogram has been performed.  Jeryl Columbialliott, Remona Boom 08/04/2016, 4:11 PM

## 2016-08-05 DIAGNOSIS — F1910 Other psychoactive substance abuse, uncomplicated: Secondary | ICD-10-CM

## 2016-08-05 DIAGNOSIS — R0789 Other chest pain: Secondary | ICD-10-CM

## 2016-08-05 DIAGNOSIS — E876 Hypokalemia: Secondary | ICD-10-CM | POA: Diagnosis present

## 2016-08-05 DIAGNOSIS — A419 Sepsis, unspecified organism: Secondary | ICD-10-CM

## 2016-08-05 DIAGNOSIS — N390 Urinary tract infection, site not specified: Principal | ICD-10-CM

## 2016-08-05 LAB — URINE CULTURE: Culture: >=100000 — AB

## 2016-08-05 LAB — BASIC METABOLIC PANEL
ANION GAP: 7 (ref 5–15)
BUN: 10 mg/dL (ref 6–20)
CALCIUM: 9.1 mg/dL (ref 8.9–10.3)
CO2: 26 mmol/L (ref 22–32)
CREATININE: 0.93 mg/dL (ref 0.44–1.00)
Chloride: 104 mmol/L (ref 101–111)
GLUCOSE: 110 mg/dL — AB (ref 65–99)
Potassium: 3.8 mmol/L (ref 3.5–5.1)
Sodium: 137 mmol/L (ref 135–145)

## 2016-08-05 MED ORDER — CIPROFLOXACIN HCL 500 MG PO TABS
500.0000 mg | ORAL_TABLET | Freq: Two times a day (BID) | ORAL | Status: DC
  Administered 2016-08-05 – 2016-08-06 (×3): 500 mg via ORAL
  Filled 2016-08-05 (×3): qty 1

## 2016-08-05 NOTE — Progress Notes (Signed)
Patient refusing AM lab draw.  On-call MD, Opyd notified.

## 2016-08-05 NOTE — Care Management Note (Addendum)
Case Management Note  Patient Details  Name: Farrel DemarkCourtney Laham MRN: [id number] Date of Birth: 1985/05/30  Subjective/Objective:  Sepsis, IVDA, bacteremia                  Action/Plan: Discharge Planning: NCM spoke to pt and [facility] with appt scheduled for 08/11/2016 at 3:30 pm. [facility] not [facility], she utilized on 3/[facility]. Pt will have to pay for meds out of pocket. Gave her information on goodrx to utilized their discount program.   Expected Discharge Date:  08/06/2016            Expected Discharge Plan:  Home/Self Care  In-House Referral:  Clinical Social Work  Discharge planning Services  CM Consult, [facility] Program, Follow-up appt scheduled  Post Acute Care Choice:  NA Choice offered to:  NA  DME Arranged:  N/A DME Agency:  NA  HH Arranged:  NA HH Agency:  NA  Status of Service:  Completed, signed off  If discussed at [employer] Meetings, dates discussed:    Additional Comments:  Elliot CousinShavis, Stephine Langbehn Ellen, RN 08/05/2016, 4:20 PM

## 2016-08-05 NOTE — Progress Notes (Signed)
Patient refusing vital sign check.  Will continue to monitor.

## 2016-08-05 NOTE — Progress Notes (Signed)
PROGRESS NOTE    Jamie Kirk  ZOX:[id number] DOB: 31-Jul-1985 DOA: 08/03/2016 PCP: Patient, No Pcp Per    Brief Narrative: Tennis Must a 31 y.o.femalewho presents with sepsis likely from IVDA and possible bacteremia w/ concurrent CP likely from vasospasm of coronary arteries and pleurisy from inhaling cocaine and other unknkown substances.   Assessment & Plan:   Active Problems:   Sepsis (HCC)   Sepsis from UTI:  Growing e coli sensitive to ciprofloxacin.  Stopped IV antibiotics. Normal lactate and  Trend pro calcitonin  Afebrile and normal wbc count.    Normocytic anemia:  Hemoglobin stable around 10.    Substance abuse: SW consulted.    Homeless   sw consulted.   Hypokalemia and hyponatremia improved with hydration.    Chest pain:  Possibly from the drug use.  Much improved.  Negative troponin. Echocardiogram is unremarkable.   DVT prophylaxis: (lovenox.  Code Status: (Full Family Communication: none atbedside. ) Disposition Plan: pending SW consult.   Consultants:   None.    Procedures: (echocardiogram.   Antimicrobials: ciprofloxacin.   Subjective: No new complaints.  No sob, chest pain improved.   Objective: Vitals:   08/03/16 2237 08/04/16 0505 08/04/16 1429 08/05/16 0550  BP:  126/84 139/89 120/68  Pulse: 72 73 91 78  Resp: 20 18 17 20   Temp: 98.2 F (36.8 C) 98 F (36.7 C) 98.9 F (37.2 C) 98.2 F (36.8 C)  TempSrc:    Oral  SpO2: 100% 100% 100% 100%  Weight:      Height:        Intake/Output Summary (Last 24 hours) at 08/05/16 1550 Last data filed at 08/05/16 1400  Gross per 24 hour  Intake              780 ml  Output                0 ml  Net              780 ml   Filed Weights   08/03/16 0721  Weight: 68 kg (150 lb)    Examination:  General exam: Appears calm and comfortable  Respiratory system: Clear to auscultation. Respiratory effort normal. Cardiovascular system: S1 & S2 heard, RRR. No JVD, murmurs,  rubs, gallops or clicks. No pedal edema. Gastrointestinal system: Abdomen is nondistended, soft and nontender. No organomegaly or masses felt. Normal bowel sounds heard. Central nervous system: Alert and oriented. No focal neurological deficits. Extremities: Symmetric 5 x 5 power. Skin: No rashes, lesions or ulcers Psychiatry: Judgement and insight appear normal. Mood & affect appropriate.     Data Reviewed: I have personally reviewed following labs and imaging studies  CBC:  Recent Labs Lab 08/03/16 0355  WBC 8.7  NEUTROABS 7.7  HGB 10.5*  HCT 31.6*  MCV 90.5  PLT 250   Basic Metabolic Panel:  Recent Labs Lab 08/03/16 0355 08/05/16 1039  NA 134* 137  K 3.1* 3.8  CL 103 104  CO2 25 26  GLUCOSE 100* 110*  BUN 12 10  CREATININE 1.10* 0.93  CALCIUM 8.8* 9.1   GFR: Estimated Creatinine Clearance: 90 mL/min (by C-G formula based on SCr of 0.93 mg/dL). Liver Function Tests:  Recent Labs Lab 08/03/16 0355  AST 23  ALT 14  ALKPHOS 68  BILITOT 1.2  PROT 6.7  ALBUMIN 3.4*    Recent Labs Lab 08/03/16 0355  LIPASE 21   No results for input(s): AMMONIA in the last 168 hours. Coagulation  Profile:  Recent Labs Lab 08/03/16 0724  INR 1.27   Cardiac Enzymes:  Recent Labs Lab 08/03/16 0724  TROPONINI <0.03   BNP (last 3 results) No results for input(s): PROBNP in the last 8760 hours. HbA1C: No results for input(s): HGBA1C in the last 72 hours. CBG: No results for input(s): GLUCAP in the last 168 hours. Lipid Profile: No results for input(s): CHOL, HDL, LDLCALC, TRIG, CHOLHDL, LDLDIRECT in the last 72 hours. Thyroid Function Tests: No results for input(s): TSH, T4TOTAL, FREET4, T3FREE, THYROIDAB in the last 72 hours. Anemia Panel: No results for input(s): VITAMINB12, FOLATE, FERRITIN, TIBC, IRON, RETICCTPCT in the last 72 hours. Sepsis Labs:  Recent Labs Lab 08/03/16 0408 08/03/16 0724 08/03/16 1025  PROCALCITON  --  3.94  --   LATICACIDVEN  0.95 1.0 1.4    Recent Results (from the past 240 hour(s))  Blood Culture (routine x 2)     Status: None (Preliminary result)   Collection Time: 08/03/16  4:00 AM  Result Value Ref Range Status   Specimen Description BLOOD RIGHT FOREARM  Final   Special Requests IN PEDIATRIC BOTTLE Blood Culture adequate volume  Final   Culture NO GROWTH 2 DAYS  Final   Report Status PENDING  Incomplete  Blood Culture (routine x 2)     Status: None (Preliminary result)   Collection Time: 08/03/16  4:15 AM  Result Value Ref Range Status   Specimen Description BLOOD LEFT FOREARM  Final   Special Requests   Final    BOTTLES DRAWN AEROBIC AND ANAEROBIC Blood Culture results may not be optimal due to an inadequate volume of blood received in culture bottles   Culture NO GROWTH 2 DAYS  Final   Report Status PENDING  Incomplete  Urine culture     Status: Abnormal   Collection Time: 08/03/16  6:10 AM  Result Value Ref Range Status   Specimen Description URINE, RANDOM  Final   Special Requests NONE  Final   Culture >=100,000 COLONIES/mL ESCHERICHIA COLI (A)  Final   Report Status 08/05/2016 FINAL  Final   Organism ID, Bacteria ESCHERICHIA COLI (A)  Final      Susceptibility   Escherichia coli - MIC*    AMPICILLIN 4 SENSITIVE Sensitive     CEFAZOLIN <=4 SENSITIVE Sensitive     CEFTRIAXONE <=1 SENSITIVE Sensitive     CIPROFLOXACIN <=0.25 SENSITIVE Sensitive     GENTAMICIN <=1 SENSITIVE Sensitive     IMIPENEM <=0.25 SENSITIVE Sensitive     NITROFURANTOIN <=16 SENSITIVE Sensitive     TRIMETH/SULFA <=20 SENSITIVE Sensitive     AMPICILLIN/SULBACTAM <=2 SENSITIVE Sensitive     PIP/TAZO <=4 SENSITIVE Sensitive     Extended ESBL NEGATIVE Sensitive     * >=100,000 COLONIES/mL ESCHERICHIA COLI         Radiology Studies: No results found.      Scheduled Meds: . ciprofloxacin  500 mg Oral BID  . enoxaparin (LOVENOX) injection  40 mg Subcutaneous Q24H  . folic acid  1 mg Oral Daily  . multivitamin  with minerals  1 tablet Oral Daily  . potassium chloride  40 mEq Oral Once  . sodium chloride flush  3 mL Intravenous Q12H  . thiamine  100 mg Oral Daily   Continuous Infusions: . sodium chloride Stopped (08/04/16 1900)     LOS: 1 day    Time spent: 35 minutes.     Kathlen ModyAKULA,Pascual Mantel, MD [facility] Pager [phone] If 7PM-7AM, please contact night-coverage [email or web address] Password  [email or web address] 08/05/2016, 3:50 PM

## 2016-08-06 MED ORDER — CIPROFLOXACIN HCL 500 MG PO TABS: 500.0000 mg | ORAL_TABLET | Freq: Two times a day (BID) | ORAL | 0 refills | Status: AC

## 2016-08-06 MED ORDER — THIAMINE HCL 100 MG PO TABS: 100.0000 mg | ORAL_TABLET | Freq: Every day | ORAL | 0 refills | Status: AC

## 2016-08-06 MED ORDER — FOLIC ACID 1 MG PO TABS: 1.0000 mg | ORAL_TABLET | Freq: Every day | ORAL | 0 refills | Status: AC

## 2016-08-06 NOTE — Progress Notes (Signed)
Patient attempting to leave the unit without discharge instructions. Patient decided to wait for her prescription. Discharge instruction given and patient has paper prescriptions. Patient has cell phone in hand and social worker provided her with clothing. Patient left unit ambulatory.

## 2016-08-06 NOTE — Clinical Social Work Note (Signed)
CSW received consult, pt documented as homeless. CSW met with pt @ bedside. Pt reports that she does have family in the area but they are not an option for housing. Pt open to going to a shelter. CSW provided shelter information, bus pass and the [employer] blue book w/food pantries. CSW also provided clothing for pt, pt reports she can't leave with paper scrubs.  Pt has no other identified DC needs.  Charod Slawinski B. Joline Maxcy Clinical Social Work Dept Weekend Social Worker [phone] 11:02 AM

## 2016-08-06 NOTE — Progress Notes (Signed)
Pt refused vital signs taken. 

## 2016-08-07 NOTE — Discharge Summary (Signed)
Physician Discharge Summary  Farrel DemarkCourtney Conran ZOX:[id number] DOB: [birth year] DOA: 08/03/2016  PCP: Patient, No Pcp Per  Admit date: 08/03/2016 Discharge date: 08/06/2016  Admitted From: Home Disposition:  Home  Recommendations for Outpatient Follow-up:  1. Follow up with PCP in 1-2 weeks 2. Please obtain BMP/CBC in one week   Discharge Condition:stable.  CODE STATUS: full code.  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 She was found to have UTI.    Discharge Diagnoses:  Active Problems:   Sepsis (HCC)   Substance abuse   Acute lower UTI   Hypokalemia  Sepsis ruled out. cultures negative.  Normal lactate.  Afebrile and normal wbc count.   UTI;  Urine cultures show E coli.  Discharge on ciprofloxacin.    Normocytic anemia; hemoglobin stable around 10.    Substance abuse; sw consulted.   Discharge Instructions  Discharge Instructions    Diet - low sodium heart healthy    Complete by:  As directed    Discharge instructions    Complete by:  As directed    Follow up with PCP in one week.     Allergies as of 08/06/2016   No Known Allergies     Medication List    TAKE these medications   ciprofloxacin 500 MG tablet Commonly known as:  CIPRO Take 1 tablet (500 mg total) by mouth 2 (two) times daily.   folic acid 1 MG tablet Commonly known as:  FOLVITE Take 1 tablet (1 mg total) by mouth daily.   thiamine 100 MG tablet Take 1 tablet (100 mg total) by mouth daily.      Follow-up Information    [facility] Follow up.   Why:  appointment scheduled for 08/11/2016 at 3:30 pm, please call to rescheduled if you cannot make the appointment Contact information: [address] [place]  [place] [id number] [phone]         No Known Allergies  Consultations:  None.    Procedures/Studies: Dg Chest 2 View  Result Date: 08/03/2016 CLINICAL DATA:  Chest pain EXAM: CHEST  2 VIEW COMPARISON:  Chest radiograph 03/24/2016 FINDINGS: The heart size and mediastinal contours are within normal limits. Both lungs are clear. The visualized skeletal structures are unremarkable. IMPRESSION: No active cardiopulmonary disease. Electronically Signed   By: Deatra RobinsonKevin  Herman M.D.   On: 08/03/2016 04:52   Dg Chest Port 1 View  Result Date: 08/03/2016 CLINICAL DATA:  Sepsis EXAM: PORTABLE CHEST 1 VIEW COMPARISON:  08/03/2016 FINDINGS: Cardiac shadow is at the upper limits of normal in size but accentuated by the portable technique. The lungs are well aerated bilaterally. Previously seen prominent nipple shadows are less well visualized. No bony abnormality is seen. IMPRESSION: No acute abnormality noted. Electronically Signed   By: Alcide CleverMark  Lukens M.D.   On: 08/03/2016 08:26       Subjective: Non complaints.   Discharge Exam: Vitals:   08/04/16 1429 08/05/16 0550  BP: 139/89 120/68  Pulse: 91 78  Resp: 17 20   Vitals:   08/03/16 2237 08/04/16 0505 08/04/16 1429 08/05/16 0550  BP:  126/84 139/89 120/68  Pulse: 72 73 91 78  Resp: 20 18 17 20   Temp:  98 F (36.7 C) 98.9 F (37.2 C) 98.2 F (36.8 C)  TempSrc:  Oral  SpO2: 100% 100% 100% 100%  Weight:      Height:        General: Pt is alert, awake, not in acute distress Cardiovascular: RRR, S1/S2 +, no rubs, no gallops Respiratory: CTA bilaterally, no wheezing, no rhonchi Abdominal: Soft, NT, ND, bowel sounds + Extremities: no edema, no cyanosis    The results of significant diagnostics from this hospitalization (including imaging, microbiology, ancillary and laboratory) are listed below for reference.     Microbiology: Recent Results (from the past 240 hour(s))  Blood Culture (routine x 2)     Status: None  (Preliminary result)   Collection Time: 08/03/16  4:00 AM  Result Value Ref Range Status   Specimen Description BLOOD RIGHT FOREARM  Final   Special Requests IN PEDIATRIC BOTTLE Blood Culture adequate volume  Final   Culture NO GROWTH 4 DAYS  Final   Report Status PENDING  Incomplete  Blood Culture (routine x 2)     Status: None (Preliminary result)   Collection Time: 08/03/16  4:15 AM  Result Value Ref Range Status   Specimen Description BLOOD LEFT FOREARM  Final   Special Requests   Final    BOTTLES DRAWN AEROBIC AND ANAEROBIC Blood Culture results may not be optimal due to an inadequate volume of blood received in culture bottles   Culture NO GROWTH 4 DAYS  Final   Report Status PENDING  Incomplete  Urine culture     Status: Abnormal   Collection Time: 08/03/16  6:10 AM  Result Value Ref Range Status   Specimen Description URINE, RANDOM  Final   Special Requests NONE  Final   Culture >=100,000 COLONIES/mL ESCHERICHIA COLI (A)  Final   Report Status 08/05/2016 FINAL  Final   Organism ID, Bacteria ESCHERICHIA COLI (A)  Final      Susceptibility   Escherichia coli - MIC*    AMPICILLIN 4 SENSITIVE Sensitive     CEFAZOLIN <=4 SENSITIVE Sensitive     CEFTRIAXONE <=1 SENSITIVE Sensitive     CIPROFLOXACIN <=0.25 SENSITIVE Sensitive     GENTAMICIN <=1 SENSITIVE Sensitive     IMIPENEM <=0.25 SENSITIVE Sensitive     NITROFURANTOIN <=16 SENSITIVE Sensitive     TRIMETH/SULFA <=20 SENSITIVE Sensitive     AMPICILLIN/SULBACTAM <=2 SENSITIVE Sensitive     PIP/TAZO <=4 SENSITIVE Sensitive     Extended ESBL NEGATIVE Sensitive     * >=100,000 COLONIES/mL ESCHERICHIA COLI     Labs: BNP (last 3 results) No results for input(s): BNP in the last 8760 hours. Basic Metabolic Panel:  Recent Labs Lab 08/03/16 0355 08/05/16 1039  NA 134* 137  K 3.1* 3.8  CL 103 104  CO2 25 26  GLUCOSE 100* 110*  BUN 12 10  CREATININE 1.10* 0.93  CALCIUM 8.8* 9.1   Liver Function Tests:  Recent  Labs Lab 08/03/16 0355  AST 23  ALT 14  ALKPHOS 68  BILITOT 1.2  PROT 6.7  ALBUMIN 3.4*    Recent Labs Lab 08/03/16 0355  LIPASE 21   No results for input(s): AMMONIA in the last 168 hours. CBC:  Recent Labs Lab 08/03/16 0355  WBC 8.7  NEUTROABS 7.7  HGB 10.5*  HCT 31.6*  MCV 90.5  PLT 250   Cardiac Enzymes:  Recent Labs Lab 08/03/16 0724  TROPONINI <0.03   BNP: Invalid input(s): POCBNP CBG: No results for input(s): GLUCAP in the last 168 hours. D-Dimer No results for input(s): DDIMER in the last 72 hours.  Hgb A1c No results for input(s): HGBA1C in the last 72 hours. Lipid Profile No results for input(s): CHOL, HDL, LDLCALC, TRIG, CHOLHDL, LDLDIRECT in the last 72 hours. Thyroid function studies No results for input(s): TSH, T4TOTAL, T3FREE, THYROIDAB in the last 72 hours.  Invalid input(s): FREET3 Anemia work up No results for input(s): VITAMINB12, FOLATE, FERRITIN, TIBC, IRON, RETICCTPCT in the last 72 hours. Urinalysis    Component Value Date/Time   COLORURINE YELLOW 08/03/2016 0610   APPEARANCEUR CLEAR 08/03/2016 0610   LABSPEC 1.013 08/03/2016 0610   PHURINE 5.0 08/03/2016 0610   GLUCOSEU NEGATIVE 08/03/2016 0610   HGBUR MODERATE (A) 08/03/2016 0610   BILIRUBINUR NEGATIVE 08/03/2016 0610   KETONESUR 5 (A) 08/03/2016 0610   PROTEINUR NEGATIVE 08/03/2016 0610   NITRITE POSITIVE (A) 08/03/2016 0610   LEUKOCYTESUR NEGATIVE 08/03/2016 0610   Sepsis Labs Invalid input(s): PROCALCITONIN,  WBC,  LACTICIDVEN Microbiology Recent Results (from the past 240 hour(s))  Blood Culture (routine x 2)     Status: None (Preliminary result)   Collection Time: 08/03/16  4:00 AM  Result Value Ref Range Status   Specimen Description BLOOD RIGHT FOREARM  Final   Special Requests IN PEDIATRIC BOTTLE Blood Culture adequate volume  Final   Culture NO GROWTH 4 DAYS  Final   Report Status PENDING  Incomplete  Blood Culture (routine x 2)     Status: None  (Preliminary result)   Collection Time: 08/03/16  4:15 AM  Result Value Ref Range Status   Specimen Description BLOOD LEFT FOREARM  Final   Special Requests   Final    BOTTLES DRAWN AEROBIC AND ANAEROBIC Blood Culture results may not be optimal due to an inadequate volume of blood received in culture bottles   Culture NO GROWTH 4 DAYS  Final   Report Status PENDING  Incomplete  Urine culture     Status: Abnormal   Collection Time: 08/03/16  6:10 AM  Result Value Ref Range Status   Specimen Description URINE, RANDOM  Final   Special Requests NONE  Final   Culture >=100,000 COLONIES/mL ESCHERICHIA COLI (A)  Final   Report Status 08/05/2016 FINAL  Final   Organism ID, Bacteria ESCHERICHIA COLI (A)  Final      Susceptibility   Escherichia coli - MIC*    AMPICILLIN 4 SENSITIVE Sensitive     CEFAZOLIN <=4 SENSITIVE Sensitive     CEFTRIAXONE <=1 SENSITIVE Sensitive     CIPROFLOXACIN <=0.25 SENSITIVE Sensitive     GENTAMICIN <=1 SENSITIVE Sensitive     IMIPENEM <=0.25 SENSITIVE Sensitive     NITROFURANTOIN <=16 SENSITIVE Sensitive     TRIMETH/SULFA <=20 SENSITIVE Sensitive     AMPICILLIN/SULBACTAM <=2 SENSITIVE Sensitive     PIP/TAZO <=4 SENSITIVE Sensitive     Extended ESBL NEGATIVE Sensitive     * >=100,000 COLONIES/mL ESCHERICHIA COLI     Time coordinating discharge: Over 30 minutes  SIGNED:   Kathlen Mody, MD  [facility] 08/07/2016, 10:34 PM Pager   If 7PM-7AM, please contact night-coverage [email or web address] Password [email or web address]

## 2016-08-08 LAB — CULTURE, BLOOD (ROUTINE X 2)
CULTURE: NO GROWTH
Culture: NO GROWTH
Special Requests: ADEQUATE

## 2016-08-11 ENCOUNTER — Inpatient Hospital Stay: Payer: Self-pay

## 2016-08-11 NOTE — Progress Notes (Deleted)
Patient ID: Jamie DemarkCourtney Lalli, female   DOB: 02/18/1985, 31 y.o.   MRN: [id number] After being hospitalized 08/03/2016-08/06/2016 for presumed sepsis secondary to IVDU and UTI and hypokalemia along with substance abuse.  Sepsis was r/o.  She did have CP believed to be from coronary vasospasm secondary to cocaine use but no acute MI/cardiac enzymes negative.  EKG without acute ischemia.  Urine + for E.coli and she was discharged on Cipro.  See hospital notes.
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
